# Patient Record
Sex: Female | Born: 1950 | Race: White | Hispanic: No
Health system: Southern US, Community
[De-identification: ages and names within clinical notes are randomized; demographics above are authoritative.]

## PROBLEM LIST (undated history)

## (undated) DIAGNOSIS — I482 Chronic atrial fibrillation, unspecified: Secondary | ICD-10-CM

## (undated) DIAGNOSIS — J449 Chronic obstructive pulmonary disease, unspecified: Secondary | ICD-10-CM

## (undated) DIAGNOSIS — M069 Rheumatoid arthritis, unspecified: Secondary | ICD-10-CM

## (undated) DIAGNOSIS — J9621 Acute and chronic respiratory failure with hypoxia: Secondary | ICD-10-CM

---

## 2018-04-27 ENCOUNTER — Inpatient Hospital Stay
Admission: RE | Admit: 2018-04-27 | Discharge: 2018-06-22 | Disposition: A | Discharge status: Skilled Nursing Facility | Payer: Medicare HMO | Source: Ambulatory Visit | Attending: Internal Medicine | Admitting: Internal Medicine

## 2018-04-27 ENCOUNTER — Other Ambulatory Visit (HOSPITAL_COMMUNITY): Payer: Medicare HMO

## 2018-04-27 DIAGNOSIS — M069 Rheumatoid arthritis, unspecified: Secondary | ICD-10-CM | POA: Diagnosis present

## 2018-04-27 DIAGNOSIS — I482 Chronic atrial fibrillation, unspecified: Secondary | ICD-10-CM | POA: Diagnosis present

## 2018-04-27 DIAGNOSIS — J9621 Acute and chronic respiratory failure with hypoxia: Secondary | ICD-10-CM | POA: Diagnosis present

## 2018-04-27 DIAGNOSIS — J9690 Respiratory failure, unspecified, unspecified whether with hypoxia or hypercapnia: Secondary | ICD-10-CM

## 2018-04-27 DIAGNOSIS — Z930 Tracheostomy status: Secondary | ICD-10-CM

## 2018-04-27 DIAGNOSIS — T17908A Unspecified foreign body in respiratory tract, part unspecified causing other injury, initial encounter: Secondary | ICD-10-CM

## 2018-04-27 DIAGNOSIS — R109 Unspecified abdominal pain: Secondary | ICD-10-CM

## 2018-04-27 DIAGNOSIS — Z931 Gastrostomy status: Secondary | ICD-10-CM

## 2018-04-27 DIAGNOSIS — J9600 Acute respiratory failure, unspecified whether with hypoxia or hypercapnia: Secondary | ICD-10-CM

## 2018-04-27 DIAGNOSIS — J189 Pneumonia, unspecified organism: Secondary | ICD-10-CM

## 2018-04-27 DIAGNOSIS — R079 Chest pain, unspecified: Secondary | ICD-10-CM

## 2018-04-27 DIAGNOSIS — J111 Influenza due to unidentified influenza virus with other respiratory manifestations: Secondary | ICD-10-CM

## 2018-04-27 DIAGNOSIS — J449 Chronic obstructive pulmonary disease, unspecified: Secondary | ICD-10-CM | POA: Diagnosis present

## 2018-04-27 HISTORY — DX: Acute and chronic respiratory failure with hypoxia: J96.21

## 2018-04-27 HISTORY — DX: Chronic obstructive pulmonary disease, unspecified: J44.9

## 2018-04-27 HISTORY — DX: Rheumatoid arthritis, unspecified: M06.9

## 2018-04-27 HISTORY — DX: Chronic atrial fibrillation, unspecified: I48.20

## 2018-04-27 LAB — BLOOD GAS, ARTERIAL
Acid-Base Excess: 5.8 mmol/L — ABNORMAL HIGH (ref 0.0–2.0)
Bicarbonate: 29.7 mmol/L — ABNORMAL HIGH (ref 20.0–28.0)
FIO2: 50
MECHVT: 400 mL
O2 Saturation: 99.4 %
PEEP: 8 cmH2O
Patient temperature: 98.6
RATE: 20 resp/min
pCO2 arterial: 42.4 mmHg (ref 32.0–48.0)
pH, Arterial: 7.46 — ABNORMAL HIGH (ref 7.350–7.450)
pO2, Arterial: 132 mmHg — ABNORMAL HIGH (ref 83.0–108.0)

## 2018-04-27 MED ORDER — IOPAMIDOL (ISOVUE-300) INJECTION 61%
INTRAVENOUS | Status: AC
  Filled 2018-04-27: qty 50

## 2018-04-28 ENCOUNTER — Encounter: Payer: Self-pay | Admitting: Internal Medicine

## 2018-04-28 DIAGNOSIS — M069 Rheumatoid arthritis, unspecified: Secondary | ICD-10-CM | POA: Diagnosis present

## 2018-04-28 DIAGNOSIS — J449 Chronic obstructive pulmonary disease, unspecified: Secondary | ICD-10-CM | POA: Diagnosis not present

## 2018-04-28 DIAGNOSIS — Z930 Tracheostomy status: Secondary | ICD-10-CM

## 2018-04-28 DIAGNOSIS — J9621 Acute and chronic respiratory failure with hypoxia: Secondary | ICD-10-CM | POA: Diagnosis not present

## 2018-04-28 DIAGNOSIS — I482 Chronic atrial fibrillation, unspecified: Secondary | ICD-10-CM | POA: Diagnosis present

## 2018-04-28 LAB — CBC WITH DIFFERENTIAL/PLATELET
Abs Immature Granulocytes: 0 10*3/uL (ref 0.00–0.07)
Basophils Absolute: 0.2 10*3/uL — ABNORMAL HIGH (ref 0.0–0.1)
Basophils Relative: 1 %
Eosinophils Absolute: 0 10*3/uL (ref 0.0–0.5)
Eosinophils Relative: 0 %
HCT: 28.9 % — ABNORMAL LOW (ref 36.0–46.0)
Hemoglobin: 9.1 g/dL — ABNORMAL LOW (ref 12.0–15.0)
LYMPHS ABS: 3.2 10*3/uL (ref 0.7–4.0)
Lymphocytes Relative: 19 %
MCH: 30.4 pg (ref 26.0–34.0)
MCHC: 31.5 g/dL (ref 30.0–36.0)
MCV: 96.7 fL (ref 80.0–100.0)
Monocytes Absolute: 1 10*3/uL (ref 0.1–1.0)
Monocytes Relative: 6 %
Neutro Abs: 12.4 10*3/uL — ABNORMAL HIGH (ref 1.7–7.7)
Neutrophils Relative %: 74 %
Platelets: 982 10*3/uL (ref 150–400)
RBC: 2.99 MIL/uL — ABNORMAL LOW (ref 3.87–5.11)
RDW: 15.8 % — ABNORMAL HIGH (ref 11.5–15.5)
WBC: 16.8 10*3/uL — ABNORMAL HIGH (ref 4.0–10.5)
nRBC: 0 /100 WBC
nRBC: 0.1 % (ref 0.0–0.2)

## 2018-04-28 LAB — T4, FREE: Free T4: 1.09 ng/dL (ref 0.82–1.77)

## 2018-04-28 LAB — COMPREHENSIVE METABOLIC PANEL
ALT: 81 U/L — ABNORMAL HIGH (ref 0–44)
AST: 65 U/L — ABNORMAL HIGH (ref 15–41)
Albumin: 1.9 g/dL — ABNORMAL LOW (ref 3.5–5.0)
Alkaline Phosphatase: 35 U/L — ABNORMAL LOW (ref 38–126)
Anion gap: 10 (ref 5–15)
BUN: 10 mg/dL (ref 8–23)
CHLORIDE: 91 mmol/L — AB (ref 98–111)
CO2: 30 mmol/L (ref 22–32)
Calcium: 8.9 mg/dL (ref 8.9–10.3)
Creatinine, Ser: 0.33 mg/dL — ABNORMAL LOW (ref 0.44–1.00)
GFR calc Af Amer: >60 mL/min (ref >60)
GFR calc non Af Amer: >60 mL/min (ref >60)
Glucose, Bld: 129 mg/dL — ABNORMAL HIGH (ref 70–99)
POTASSIUM: 4 mmol/L (ref 3.5–5.1)
Sodium: 131 mmol/L — ABNORMAL LOW (ref 135–145)
Total Bilirubin: 0.5 mg/dL (ref 0.3–1.2)
Total Protein: 5.9 g/dL — ABNORMAL LOW (ref 6.5–8.1)

## 2018-04-28 LAB — MAGNESIUM: Magnesium: 1.7 mg/dL (ref 1.7–2.4)

## 2018-04-28 LAB — PROTIME-INR
INR: 1.77
Prothrombin Time: 20.4 seconds — ABNORMAL HIGH (ref 11.4–15.2)

## 2018-04-28 LAB — PHOSPHORUS: Phosphorus: 4 mg/dL (ref 2.5–4.6)

## 2018-04-28 LAB — TSH: TSH: 2.521 u[IU]/mL (ref 0.350–4.500)

## 2018-04-28 LAB — HEMOGLOBIN A1C
Hgb A1c MFr Bld: 5.2 % (ref 4.8–5.6)
Mean Plasma Glucose: 102.54 mg/dL

## 2018-04-28 MED ORDER — CULTURELLE PO CAPS
1.00 | ORAL_CAPSULE | ORAL | Status: DC
Start: 2018-04-27 — End: 2018-04-28

## 2018-04-28 MED ORDER — GENERIC EXTERNAL MEDICATION
1000.00 | Status: DC
Start: ? — End: 2018-04-28

## 2018-04-28 MED ORDER — CHLORHEXIDINE GLUCONATE 0.12 % MT SOLN
15.00 | OROMUCOSAL | Status: DC
Start: 2018-04-27 — End: 2018-04-28

## 2018-04-28 MED ORDER — DEXTROSE 10 % IV SOLN
250.00 | INTRAVENOUS | Status: DC
Start: ? — End: 2018-04-28

## 2018-04-28 MED ORDER — GENERIC EXTERNAL MEDICATION
2.50 | Status: DC
Start: ? — End: 2018-04-28

## 2018-04-28 MED ORDER — LORAZEPAM 2 MG/ML IJ SOLN
2.00 | INTRAMUSCULAR | Status: DC
Start: ? — End: 2018-04-28

## 2018-04-28 MED ORDER — METOPROLOL TARTRATE 25 MG PO TABS
25.00 | ORAL_TABLET | ORAL | Status: DC
Start: 2018-04-27 — End: 2018-04-28

## 2018-04-28 MED ORDER — ACETAMINOPHEN 325 MG PO TABS
650.00 | ORAL_TABLET | ORAL | Status: DC
Start: ? — End: 2018-04-28

## 2018-04-28 MED ORDER — MONTELUKAST SODIUM 10 MG PO TABS
10.00 | ORAL_TABLET | ORAL | Status: DC
Start: 2018-04-27 — End: 2018-04-28

## 2018-04-28 MED ORDER — FAMOTIDINE 20 MG/2ML IV SOLN
20.00 | INTRAVENOUS | Status: DC
Start: 2018-04-27 — End: 2018-04-28

## 2018-04-28 MED ORDER — ONDANSETRON 4 MG PO TBDP
4.00 | ORAL_TABLET | ORAL | Status: DC
Start: ? — End: 2018-04-28

## 2018-04-28 MED ORDER — GENERIC EXTERNAL MEDICATION
10.00 | Status: DC
Start: ? — End: 2018-04-28

## 2018-04-28 MED ORDER — GENERIC EXTERNAL MEDICATION
1.00 | Status: DC
Start: ? — End: 2018-04-28

## 2018-04-28 MED ORDER — PROSOURCE TF PO LIQD
90.00 | ORAL | Status: DC
Start: 2018-04-28 — End: 2018-04-28

## 2018-04-28 MED ORDER — ONDANSETRON HCL 4 MG/2ML IJ SOLN
4.00 | INTRAMUSCULAR | Status: DC
Start: ? — End: 2018-04-28

## 2018-04-28 MED ORDER — NYSTATIN 100000 UNIT/GM EX POWD
CUTANEOUS | Status: DC
Start: 2018-04-27 — End: 2018-04-28

## 2018-04-28 MED ORDER — MELATONIN 3 MG PO TABS
3.00 | ORAL_TABLET | ORAL | Status: DC
Start: ? — End: 2018-04-28

## 2018-04-28 MED ORDER — GENERIC EXTERNAL MEDICATION
90.00 | Status: DC
Start: 2018-04-27 — End: 2018-04-28

## 2018-04-28 MED ORDER — GENERIC EXTERNAL MEDICATION
5.00 | Status: DC
Start: ? — End: 2018-04-28

## 2018-04-28 MED ORDER — INSULIN ASPART 100 UNIT/ML ~~LOC~~ SOLN
SUBCUTANEOUS | Status: DC
Start: 2018-04-27 — End: 2018-04-28

## 2018-04-28 MED ORDER — DOCUSATE SODIUM 100 MG PO CAPS
100.00 | ORAL_CAPSULE | ORAL | Status: DC
Start: ? — End: 2018-04-28

## 2018-04-28 MED ORDER — ASPIRIN 81 MG PO CHEW
81.00 | CHEWABLE_TABLET | ORAL | Status: DC
Start: 2018-04-28 — End: 2018-04-28

## 2018-04-28 MED ORDER — GENERIC EXTERNAL MEDICATION
30.00 | Status: DC
Start: 2018-04-28 — End: 2018-04-28

## 2018-04-28 MED ORDER — DIGOXIN 0.05 MG/ML PO SOLN
250.00 | ORAL | Status: DC
Start: 2018-04-28 — End: 2018-04-28

## 2018-04-28 MED ORDER — OXYCODONE HCL 5 MG/5ML PO SOLN
10.00 | ORAL | Status: DC
Start: 2018-04-27 — End: 2018-04-28

## 2018-04-28 MED ORDER — APIXABAN 5 MG PO TABS
5.00 | ORAL_TABLET | ORAL | Status: DC
Start: 2018-04-27 — End: 2018-04-28

## 2018-04-28 MED ORDER — GENERIC EXTERNAL MEDICATION
12.00 | Status: DC
Start: ? — End: 2018-04-28

## 2018-04-28 MED ORDER — GENERIC EXTERNAL MEDICATION
4000.00 | Status: DC
Start: ? — End: 2018-04-28

## 2018-04-28 MED ORDER — GENERIC EXTERNAL MEDICATION
650.00 | Status: DC
Start: ? — End: 2018-04-28

## 2018-04-28 MED ORDER — MAGNESIUM SULFATE 4 GM/100ML IV SOLN
4.00 | INTRAVENOUS | Status: DC
Start: ? — End: 2018-04-28

## 2018-04-28 MED ORDER — NALOXONE HCL 0.4 MG/ML IJ SOLN
.04 | INTRAMUSCULAR | Status: DC
Start: ? — End: 2018-04-28

## 2018-04-28 MED ORDER — LORAZEPAM 1 MG PO TABS
1.00 | ORAL_TABLET | ORAL | Status: DC
Start: 2018-04-27 — End: 2018-04-28

## 2018-04-28 MED ORDER — GENERIC EXTERNAL MEDICATION
180.00 | Status: DC
Start: 2018-04-27 — End: 2018-04-28

## 2018-04-28 MED ORDER — IPRATROPIUM-ALBUTEROL 0.5-2.5 (3) MG/3ML IN SOLN
3.00 | RESPIRATORY_TRACT | Status: DC
Start: 2018-04-27 — End: 2018-04-28

## 2018-04-28 MED ORDER — PREDNISONE 5 MG PO TABS
5.00 | ORAL_TABLET | ORAL | Status: DC
Start: 2018-04-28 — End: 2018-04-28

## 2018-04-28 MED ORDER — LORATADINE 10 MG PO TABS
10.00 | ORAL_TABLET | ORAL | Status: DC
Start: 2018-04-28 — End: 2018-04-28

## 2018-04-28 MED ORDER — CARBOXYMETHYLCELLULOSE SOD PF 0.5 % OP SOLN
1.00 | OPHTHALMIC | Status: DC
Start: 2018-04-27 — End: 2018-04-28

## 2018-04-28 NOTE — Consult Note (Signed)
Pulmonary Critical Care Medicine [facility] [place]  PULMONARY SERVICE  Date of Service: 04/28/2018  PULMONARY CRITICAL CARE CONSULT   Brittney Lowe  MAY:[id number]  DOB: 1950-09-22   DOA: 04/27/2018  Referring Physician: Carron Curie, MD  HPI: Brittney Lowe is a 68 y.o. female seen for follow up of Acute on Chronic Respiratory Failure.  Patient has complicated history as follows has a past medical history significant for gastroesophageal reflux disease hypertension COPD chronic respiratory failure with oxygen dependence at home rheumatoid arthritis chronic anxiety disorder.  Patient developed increasing shortness of breath from her baseline and she was not able to perform any of her activities because of the shortness of breath.  She had been having some cough low-grade fever.  Patient was bringing up dark green sputum for about 2 or 3 weeks.  In the transferring facility patient was admitted to at the time of evaluation was felt to have acute pneumonia she was treated with Rocephin and azithromycin and also was given some Zofran for vomiting.  She ended up having to be intubated.  Subsequent hospital course she was attempted at noninvasive ventilation initially which she refused.  The patient was initially started on pressure control and subsequently weaned to pressure support ventilation and switched over to volume control ventilation.  The patient was attempted at weaning however she failed and eventually ended up having to have a tracheostomy.  Review of Systems:  ROS performed and is unremarkable other than noted above.  PAST MEDICAL HISTORY Past Medical History:  Diagnosis Date  . CAD (coronary artery disease)  . Community acquired pneumonia  . COPD (chronic obstructive pulmonary disease) (HCC)  oxygen 2L PRN AND HS  . GERD (gastroesophageal reflux disease)  . History of home oxygen therapy  . Hypertension  . Latex sensitivity  . MI (myocardial infarction)  (HCC) 2000  . RA (rheumatoid arthritis) (HCC)   PAST SURGICAL HISTORY Past Surgical History:  Procedure Laterality Date  . HX CHOLECYSTECTOMY  . HX HEART CATHETERIZATION 2000,  . HX PARTIAL HYSTERECTOMY  . HX SPINAL SURGERY  . PR COLONOSCOPY FLX DX W/COLLJ SPEC WHEN PFRMD N/A 05/27/2015  COLONOSCOPY FLX DX W/COLLJ SPEC WHEN PFRMD performed by Ardine Eng, MD at [facility]   ALLERGIES Allergies  Allergen Reactions  . Moxifloxacin Hives  . Adhesive Tape-Silicones Swelling  . Amoxicillin-Pot Clavulanate Nausea and Vomiting and Abdominal Pain  . Clarithromycin Nausea and Vomiting, Abdominal Pain and Other (See Comments)  . Codeine Nausea and Vomiting  . Latex Rash and Other (See Comments)  . Spiriva Respimat [Tiotropium Bromide] Other (See Comments)  Sore throat and blisters in throat  . Sulfa (Sulfonamide Antibiotics) Unknown and Nausea and Vomiting  Patient does not remember, it was many years ago (>10)  . Sulfamethoxazole-Trimethoprim Unknown and Nausea and Vomiting  . Tiotropium Other (See Comments)  Other reaction(s): Pain in throat (finding)   FAMILY HISTORY Family History  Problem Relation Name Age of Onset  . COPD Father  . Heart Disease Father  . Heart Disease Mother  . COPD Mother  . Lung Cancer Mother   SOCIAL HISTORY Social History   Tobacco Use  . Smoking status: Former Smoker  Last attempt to quit: 04/18/2011  Years since quitting: 6.9  . Smokeless tobacco: Never Used  Substance Use Topics  . Alcohol use: No  . Drug use: No     Medications: Reviewed on Rounds  Physical Exam:  Vitals: Temperature 98.1 pulse 106 respiratory 14 blood pressure  140/64 saturations 100%  Ventilator Settings mode of ventilation assist control FiO2 50% tidal volume 379 PEEP 8  . General: Comfortable at this time . Eyes: Grossly normal lids, irises & conjunctiva . ENT: grossly tongue is normal . Neck: no obvious mass . Cardiovascular: S1-S2 normal no gallop or  rub . Respiratory: Coarse rhonchi noted bilaterally . Abdomen: Soft and nontender . Skin: no rash seen on limited exam . Musculoskeletal: not rigid . Psychiatric:unable to assess . Neurologic: no seizure no involuntary movements         Labs on Admission:  Basic Metabolic Panel: Recent Labs  Lab 04/28/18 0514  NA 131*  K 4.0  CL 91*  CO2 30  GLUCOSE 129*  BUN 10  CREATININE 0.33*  CALCIUM 8.9  MG 1.7  PHOS 4.0    Recent Labs  Lab 04/27/18 1801  PHART 7.460*  PCO2ART 42.4  PO2ART 132*  HCO3 29.7*  O2SAT 99.4    Liver Function Tests: Recent Labs  Lab 04/28/18 0514  AST 65*  ALT 81*  ALKPHOS 35*  BILITOT 0.5  PROT 5.9*  ALBUMIN 1.9*   No results for input(s): LIPASE, AMYLASE in the last 168 hours. No results for input(s): AMMONIA in the last 168 hours.  CBC: Recent Labs  Lab 04/28/18 0514  WBC 16.8*  NEUTROABS 12.4*  HGB 9.1*  HCT 28.9*  MCV 96.7  PLT 982*    Cardiac Enzymes: No results for input(s): CKTOTAL, CKMB, CKMBINDEX, TROPONINI in the last 168 hours.  BNP (last 3 results) No results for input(s): BNP in the last 8760 hours.  ProBNP (last 3 results) No results for input(s): PROBNP in the last 8760 hours.   Radiological Exams on Admission: Dg Chest Port 1 View  Result Date: 04/27/2018 CLINICAL DATA:  Tracheotomy status.  Patient for PEG tube placement. EXAM: PORTABLE CHEST 1 VIEW COMPARISON:  None. FINDINGS: Tracheostomy tube tip projects within the upper thoracic trachea, 4.7 cm above the Carina. Right PICC has its tip in the mid superior vena cava. Cardiac silhouette is normal in size. No mediastinal or hilar masses. Lungs are hyperexpanded. There are prominent bronchovascular markings with mild interstitial thickening most evident in the bases. No evidence of pneumonia or pulmonary edema. No pleural effusion or pneumothorax. Skeletal structures are grossly intact. IMPRESSION: 1. No acute cardiopulmonary disease. 2. Well-positioned  tracheostomy tube. PICC tip projects in the mid superior vena cava. Electronically Signed   By: Amie Portlandavid  Ormond M.D.   On: 04/27/2018 18:16   Dg Abd Portable 1v  Result Date: 04/27/2018 CLINICAL DATA:  Evaluate gastrostomy tube position at bedside. EXAM: PORTABLE ABDOMEN - 1 VIEW COMPARISON:  None. FINDINGS: Approximately 40 mL of Isovue 300 contrast were administered via the indwelling gastrostomy tube. The G tube is appropriately positioned within the proximal body of the stomach. There is no evidence of contrast extravasation. Contrast fills the normal-appearing gastric fundus. Moderate gaseous distension of loops of small bowel in the upper abdomen, with gas-filled normal caliber loops of small bowel throughout the remainder of the abdomen. Gas throughout normal caliber colon. No suggestion of free air on the supine image. Aortoiliac atherosclerosis without evidence of aneurysm. Degenerative changes involving lumbar spine. IMPRESSION: 1. The G-tube is appropriately positioned within the proximal body of the stomach. No evidence of contrast extravasation. 2. Mild generalized ileus. 3.  Aortic Atherosclerosis (ICD10-170.0) Electronically Signed   By: Hulan Saashomas  Lawrence M.D.   On: 04/27/2018 19:31    Assessment/Plan Active Problems:   Acute on  chronic respiratory failure with hypoxia (HCC)   COPD, severe (HCC)   Rheumatoid arthritis (HCC)   Steroid-dependent chronic obstructive pulmonary disease (HCC)   Atrial fibrillation, chronic   1. Acute on chronic respiratory failure with hypoxia patient has got severe COPD as a cause of her respiratory failure.  She is also chronic oxygen dependence.  The patient will be assessed within our SBI and we will try to wean her as she is able to tolerate. 2. Severe COPD she is oxygen dependent current inhaler regimen nebulizers as needed. 3. Chronic atrial fibrillation with rapid ventricular response treated we will continue with supportive care. 4. Rheumatoid  arthritis she is immunocompromised essentially because of the rheumatoid arthritis continue with supportive care. 5. Chronic steroid dependent we will monitor her pressures and electrolytes closely.  Continue with present management.  I have personally seen and evaluated the patient, evaluated laboratory and imaging results, formulated the assessment and plan and placed orders.  Patient is critically ill in danger of cardiac arrest and death The Patient requires high complexity decision making for assessment and support.  Case was discussed on Rounds with the Respiratory Therapy Staff Time Spent 70minutes  Yevonne PaxSaadat A , MD [facility] Pulmonary Critical Care Medicine Sleep Medicine

## 2018-04-29 DIAGNOSIS — I482 Chronic atrial fibrillation, unspecified: Secondary | ICD-10-CM | POA: Diagnosis not present

## 2018-04-29 DIAGNOSIS — J9621 Acute and chronic respiratory failure with hypoxia: Secondary | ICD-10-CM | POA: Diagnosis not present

## 2018-04-29 DIAGNOSIS — J449 Chronic obstructive pulmonary disease, unspecified: Secondary | ICD-10-CM | POA: Diagnosis not present

## 2018-04-29 DIAGNOSIS — M069 Rheumatoid arthritis, unspecified: Secondary | ICD-10-CM | POA: Diagnosis not present

## 2018-04-29 LAB — BASIC METABOLIC PANEL
Anion gap: 9 (ref 5–15)
BUN: 13 mg/dL (ref 8–23)
CALCIUM: 8.7 mg/dL — AB (ref 8.9–10.3)
CO2: 28 mmol/L (ref 22–32)
Chloride: 93 mmol/L — ABNORMAL LOW (ref 98–111)
Creatinine, Ser: <0.3 mg/dL — ABNORMAL LOW (ref 0.44–1.00)
Glucose, Bld: 170 mg/dL — ABNORMAL HIGH (ref 70–99)
Potassium: 4.6 mmol/L (ref 3.5–5.1)
SODIUM: 130 mmol/L — AB (ref 135–145)

## 2018-04-29 LAB — PATHOLOGIST SMEAR REVIEW

## 2018-04-29 LAB — MAGNESIUM: Magnesium: 1.7 mg/dL (ref 1.7–2.4)

## 2018-04-29 NOTE — Consult Note (Signed)
Referring Physician: Dr. Winn JockAli Hijazi  Brittney Lowe is an 68 y.o. female.                       Chief Complaint: SVT evaluation  HPI: 68 year old female with acute respiratory failure, COPD, HTN, rheumatoid arthritis, pneumonia, status post tracheostomy after failed extubation has episodes of SVT treated with digoxin, diltiazem and metoprolol. EKG now shows atrial flutter with controlled ventricular response. Patient is not communicative.  Past Medical History:  Diagnosis Date  . Acute on chronic respiratory failure with hypoxia (HCC)   . Atrial fibrillation, chronic   . COPD, severe (HCC)   . Rheumatoid arthritis (HCC)   . Steroid-dependent chronic obstructive pulmonary disease (HCC)       The histories are not reviewed yet. Please review them in the "History" navigator section and refresh this SmartLink.  No family history on file. Social History:  has no history on file for tobacco, alcohol, and drug.  Allergies: Moxifloxacin, Augmentin, Clarithromycin, Codeine, Spiriva, Latex and Sulfa antibiotics.  No medications prior to admission.    Results for orders placed or performed during the hospital encounter of 04/27/18 (from the past 48 hour(s))  Blood gas, arterial     Status: Abnormal   Collection Time: 04/27/18  6:01 PM  Result Value Ref Range   FIO2 50.00    Delivery systems VENTILATOR    Mode ASSIST CONTROL    VT 400 mL   LHR 20 resp/min   Peep/cpap 8.0 cm H20   pH, Arterial 7.460 (H) 7.350 - 7.450   pCO2 arterial 42.4 32.0 - 48.0 mmHg   pO2, Arterial 132 (H) 83.0 - 108.0 mmHg   Bicarbonate 29.7 (H) 20.0 - 28.0 mmol/L   Acid-Base Excess 5.8 (H) 0.0 - 2.0 mmol/L   O2 Saturation 99.4 %   Patient temperature 98.6    Collection site RIGHT RADIAL    Drawn by COLLECTED BY RT    Sample type ARTERIAL DRAW    Allens test (pass/fail) PASS PASS  Comprehensive metabolic panel     Status: Abnormal   Collection Time: 04/28/18  5:14 AM  Result Value Ref Range   Sodium  131 (L) 135 - 145 mmol/L   Potassium 4.0 3.5 - 5.1 mmol/L   Chloride 91 (L) 98 - 111 mmol/L   CO2 30 22 - 32 mmol/L   Glucose, Bld 129 (H) 70 - 99 mg/dL   BUN 10 8 - 23 mg/dL   Creatinine, Ser 1.610.33 (L) 0.44 - 1.00 mg/dL   Calcium 8.9 8.9 - 09.610.3 mg/dL   Total Protein 5.9 (L) 6.5 - 8.1 g/dL   Albumin 1.9 (L) 3.5 - 5.0 g/dL   AST 65 (H) 15 - 41 U/L   ALT 81 (H) 0 - 44 U/L   Alkaline Phosphatase 35 (L) 38 - 126 U/L   Total Bilirubin 0.5 0.3 - 1.2 mg/dL   GFR calc non Af Amer >60 >60 mL/min   GFR calc Af Amer >60 >60 mL/min   Anion gap 10 5 - 15    Comment: Performed at [facility], [address] [place], [place] [address]  Magnesium     Status: None   Collection Time: 04/28/18  5:14 AM  Result Value Ref Range   Magnesium 1.7 1.7 - 2.4 mg/dL    Comment: Performed at [facility], [address] [place], [place] [address]  Phosphorus     Status: None  Collection Time: 04/28/18  5:14 AM  Result Value Ref Range   Phosphorus 4.0 2.5 - 4.6 mg/dL    Comment: Performed at [facility], [address], [place], [place] [address]  Hemoglobin A1c     Status: None   Collection Time: 04/28/18  5:14 AM  Result Value Ref Range   Hgb A1c MFr Bld 5.2 4.8 - 5.6 %    Comment: (NOTE) Pre diabetes:          5.7%-6.4% Diabetes:              >6.4% Glycemic control for   <7.0% adults with diabetes    Mean Plasma Glucose 102.54 mg/dL    Comment: Performed at [facility], [address] [place], [place] [address]  TSH     Status: None   Collection Time: 04/28/18  5:14 AM  Result Value Ref Range   TSH 2.521 0.350 - 4.500 uIU/mL    Comment: Performed by a 3rd Generation assay with a functional sensitivity of <=0.01 uIU/mL. Performed at [facility], [address] [place], [place] [address]   T4, free     Status: None   Collection Time: 04/28/18  5:14 AM  Result Value Ref Range   Free T4 1.09 0.82 - 1.77 ng/dL    Comment: (NOTE) Biotin ingestion may interfere  with free T4 tests. If the results are inconsistent with the TSH level, previous test results, or the clinical presentation, then consider biotin interference. If needed, order repeat testing after stopping biotin. Performed at [facility], [address] [place], [place] [address]   CBC with Differential/Platelet     Status: Abnormal   Collection Time: 04/28/18  5:14 AM  Result Value Ref Range   WBC 16.8 (H) 4.0 - 10.5 K/uL   RBC 2.99 (L) 3.87 - 5.11 MIL/uL   Hemoglobin 9.1 (L) 12.0 - 15.0 g/dL   HCT 83.8 (L) 18.4 - 03.7 %   MCV 96.7 80.0 - 100.0 fL   MCH 30.4 26.0 - 34.0 pg   MCHC 31.5 30.0 - 36.0 g/dL   RDW 54.3 (H) 60.6 - 77.0 %   Platelets 982 (HH) 150 - 400 K/uL    Comment: REPEATED TO VERIFY THIS CRITICAL RESULT HAS VERIFIED AND BEEN CALLED TO E.ASHLEY,RN BY GEOFFREY MCADOO ON 01 12 2020 AT 0553, AND HAS BEEN READ BACK.     nRBC 0.1 0.0 - 0.2 %   Neutrophils Relative % 74 %   Neutro Abs 12.4 (H) 1.7 - 7.7 K/uL   Lymphocytes Relative 19 %   Lymphs Abs 3.2 0.7 - 4.0 K/uL   Monocytes Relative 6 %   Monocytes Absolute 1.0 0.1 - 1.0 K/uL   Eosinophils Relative 0 %   Eosinophils Absolute 0.0 0.0 - 0.5 K/uL   Basophils Relative 1 %   Basophils Absolute 0.2 (H) 0.0 - 0.1 K/uL   WBC Morphology See Note     Comment: Mild Left Shift. 1 to 5% Metas and Myelos, Occ Pro Noted.   nRBC 0 0 /100 WBC   Abs Immature Granulocytes 0.00 0.00 - 0.07 K/uL   Polychromasia PRESENT     Comment: Performed at [facility], [address], [place], [place] [address]  Protime-INR     Status: Abnormal   Collection Time: 04/28/18  5:14 AM  Result Value Ref Range   Prothrombin Time 20.4 (H) 11.4 - 15.2 seconds   INR 1.77  Comment: Performed at [facility], [address], [place], [place] [address]   Dg Chest Port 1 View  Result Date: 04/27/2018 CLINICAL DATA:  Tracheotomy status.  Patient for PEG tube placement. EXAM: PORTABLE CHEST 1 VIEW COMPARISON:  None. FINDINGS:  Tracheostomy tube tip projects within the upper thoracic trachea, 4.7 cm above the Carina. Right PICC has its tip in the mid superior vena cava. Cardiac silhouette is normal in size. No mediastinal or hilar masses. Lungs are hyperexpanded. There are prominent bronchovascular markings with mild interstitial thickening most evident in the bases. No evidence of pneumonia or pulmonary edema. No pleural effusion or pneumothorax. Skeletal structures are grossly intact. IMPRESSION: 1. No acute cardiopulmonary disease. 2. Well-positioned tracheostomy tube. PICC tip projects in the mid superior vena cava. Electronically Signed   By: Amie Portland M.D.   On: 04/27/2018 18:16   Dg Abd Portable 1v  Result Date: 04/27/2018 CLINICAL DATA:  Evaluate gastrostomy tube position at bedside. EXAM: PORTABLE ABDOMEN - 1 VIEW COMPARISON:  None. FINDINGS: Approximately 40 mL of Isovue 300 contrast were administered via the indwelling gastrostomy tube. The G tube is appropriately positioned within the proximal body of the stomach. There is no evidence of contrast extravasation. Contrast fills the normal-appearing gastric fundus. Moderate gaseous distension of loops of small bowel in the upper abdomen, with gas-filled normal caliber loops of small bowel throughout the remainder of the abdomen. Gas throughout normal caliber colon. No suggestion of free air on the supine image. Aortoiliac atherosclerosis without evidence of aneurysm. Degenerative changes involving lumbar spine. IMPRESSION: 1. The G-tube is appropriately positioned within the proximal body of the stomach. No evidence of contrast extravasation. 2. Mild generalized ileus. 3.  Aortic Atherosclerosis (ICD10-170.0) Electronically Signed   By: Hulan Saas M.D.   On: 04/27/2018 19:31    Review Of Systems Unable to obtain.   There were no vitals taken for this visit. There is no height or weight on file to calculate BMI. General appearance: appears stated age and in  mild respiratory distress Head: Normocephalic, atraumatic. Eyes: Blue eyes, pink conjunctiva, corneas clear.  Neck: No adenopathy, no carotid bruit, no JVD, supple, symmetrical, tracheostomy tube in place. Thyroid not enlarged. Resp: Clearing to auscultation bilaterally. Cardio: Regular rate and rhythm, S1, S2 normal, II/VI systolic murmur, no click, rub or gallop GI: Soft, non-tender; bowel sounds normal; no organomegaly. PEG tube with binder in place. Extremities: 1 + edema, no cyanosis or clubbing. Skin: Warm and dry.  Neurologic: Alert and oriented X 0.   Assessment/Plan Atrial flutter with RVR Chronic respiratory failure HTN COPD Rheumatoid arthritis  Add amiodarone 200 mg. Daily. Echocardiogram for LVF and Valvular function, LA, RA size.  Ricki Rodriguez, MD  04/29/2018, 11:06 AM

## 2018-04-29 NOTE — Progress Notes (Signed)
Pulmonary Critical Care Medicine [facility] [place]   PULMONARY CRITICAL CARE SERVICE  PROGRESS NOTE  Date of Service: 04/29/2018  Brittney Lowe  UDJ:[id number]  DOB: 1950/05/10   DOA: 04/27/2018  Referring Physician: Carron Curie, MD  HPI: Brittney Lowe is a 68 y.o. female seen for follow up of Acute on Chronic Respiratory Failure.  Patient has been having issues with cardiac arrhythmias.  She has been noted to be significantly tachycardic she was seen by cardiology and their recommendations have been duly noted.  In the meantime we will keep her on the ventilator and full support not to be weaned at this time  Medications: Reviewed on Rounds  Physical Exam:  Vitals: Temperature 98.6 pulse has been 88-170 respiratory rate 36 blood pressure 136/68 saturations are 100%  Ventilator Settings mode ventilation assist control FiO2 40% tidal line 383 PEEP 8  . General: Comfortable at this time . Eyes: Grossly normal lids, irises & conjunctiva . ENT: grossly tongue is normal . Neck: no obvious mass . Cardiovascular: S1 S2 normal no gallop . Respiratory: No rhonchi no rales are noted at this time . Abdomen: soft . Skin: no rash seen on limited exam . Musculoskeletal: not rigid . Psychiatric:unable to assess . Neurologic: no seizure no involuntary movements         Lab Data:   Basic Metabolic Panel: Recent Labs  Lab 04/28/18 0514 04/29/18 1033  NA 131* 130*  K 4.0 4.6  CL 91* 93*  CO2 30 28  GLUCOSE 129* 170*  BUN 10 13  CREATININE 0.33* <0.30*  CALCIUM 8.9 8.7*  MG 1.7 1.7  PHOS 4.0  --     ABG: Recent Labs  Lab 04/27/18 1801  PHART 7.460*  PCO2ART 42.4  PO2ART 132*  HCO3 29.7*  O2SAT 99.4    Liver Function Tests: Recent Labs  Lab 04/28/18 0514  AST 65*  ALT 81*  ALKPHOS 35*  BILITOT 0.5  PROT 5.9*  ALBUMIN 1.9*   No results for input(s): LIPASE, AMYLASE in the last 168 hours. No results for input(s): AMMONIA in the last 168  hours.  CBC: Recent Labs  Lab 04/28/18 0514  WBC 16.8*  NEUTROABS 12.4*  HGB 9.1*  HCT 28.9*  MCV 96.7  PLT 982*    Cardiac Enzymes: No results for input(s): CKTOTAL, CKMB, CKMBINDEX, TROPONINI in the last 168 hours.  BNP (last 3 results) No results for input(s): BNP in the last 8760 hours.  ProBNP (last 3 results) No results for input(s): PROBNP in the last 8760 hours.  Radiological Exams: Dg Chest Port 1 View  Result Date: 04/27/2018 CLINICAL DATA:  Tracheotomy status.  Patient for PEG tube placement. EXAM: PORTABLE CHEST 1 VIEW COMPARISON:  None. FINDINGS: Tracheostomy tube tip projects within the upper thoracic trachea, 4.7 cm above the Carina. Right PICC has its tip in the mid superior vena cava. Cardiac silhouette is normal in size. No mediastinal or hilar masses. Lungs are hyperexpanded. There are prominent bronchovascular markings with mild interstitial thickening most evident in the bases. No evidence of pneumonia or pulmonary edema. No pleural effusion or pneumothorax. Skeletal structures are grossly intact. IMPRESSION: 1. No acute cardiopulmonary disease. 2. Well-positioned tracheostomy tube. PICC tip projects in the mid superior vena cava. Electronically Signed   By: Amie Portland M.D.   On: 04/27/2018 18:16   Dg Abd Portable 1v  Result Date: 04/27/2018 CLINICAL DATA:  Evaluate gastrostomy tube position at bedside. EXAM: PORTABLE ABDOMEN - 1 VIEW COMPARISON:  None. FINDINGS: Approximately 40 mL of Isovue 300 contrast were administered via the indwelling gastrostomy tube. The G tube is appropriately positioned within the proximal body of the stomach. There is no evidence of contrast extravasation. Contrast fills the normal-appearing gastric fundus. Moderate gaseous distension of loops of small bowel in the upper abdomen, with gas-filled normal caliber loops of small bowel throughout the remainder of the abdomen. Gas throughout normal caliber colon. No suggestion of free air  on the supine image. Aortoiliac atherosclerosis without evidence of aneurysm. Degenerative changes involving lumbar spine. IMPRESSION: 1. The G-tube is appropriately positioned within the proximal body of the stomach. No evidence of contrast extravasation. 2. Mild generalized ileus. 3.  Aortic Atherosclerosis (ICD10-170.0) Electronically Signed   By: Hulan Saas M.D.   On: 04/27/2018 19:31    Assessment/Plan Active Problems:   Acute on chronic respiratory failure with hypoxia (HCC)   COPD, severe (HCC)   Rheumatoid arthritis (HCC)   Steroid-dependent chronic obstructive pulmonary disease (HCC)   Atrial fibrillation, chronic   1. Acute on chronic respiratory failure with hypoxia we will continue with full support on the ventilator.  Titrate oxygen down as tolerated no weaning for today. 2. Severe COPD at baseline continue present management 3. Rheumatoid arthritis continue with supportive care 4. Steroid-dependent COPD severe disease 5. Chronic atrial fibrillation rapid ventricular response cardiology is seeing the patient   I have personally seen and evaluated the patient, evaluated laboratory and imaging results, formulated the assessment and plan and placed orders. The Patient requires high complexity decision making for assessment and support.  Case was discussed on Rounds with the Respiratory Therapy Staff  Yevonne Pax, MD [facility] Pulmonary Critical Care Medicine Sleep Medicine

## 2018-04-30 DIAGNOSIS — M069 Rheumatoid arthritis, unspecified: Secondary | ICD-10-CM | POA: Diagnosis not present

## 2018-04-30 DIAGNOSIS — J9621 Acute and chronic respiratory failure with hypoxia: Secondary | ICD-10-CM | POA: Diagnosis not present

## 2018-04-30 DIAGNOSIS — I482 Chronic atrial fibrillation, unspecified: Secondary | ICD-10-CM | POA: Diagnosis not present

## 2018-04-30 DIAGNOSIS — J449 Chronic obstructive pulmonary disease, unspecified: Secondary | ICD-10-CM | POA: Diagnosis not present

## 2018-04-30 NOTE — Progress Notes (Signed)
Pulmonary Critical Care Medicine [facility] [place]   PULMONARY CRITICAL CARE SERVICE  PROGRESS NOTE  Date of Service: 04/30/2018  Brittney Lowe  PYK:[id number]  DOB: Jan 27, 1951   DOA: 04/27/2018  Referring Physician: Carron Curie, MD  HPI: Brittney Lowe is a 68 y.o. female seen for follow up of Acute on Chronic Respiratory Failure.  Patient was on full support apparently the RSB I was poor  Medications: Reviewed on Rounds  Physical Exam:  Vitals: Temperature 97.7 pulse 84 respiratory 22 blood pressure 149/80 saturations 100%  Ventilator Settings currently is on assist control FiO2 40% PEEP 8 tidal volume 456  . General: Comfortable at this time . Eyes: Grossly normal lids, irises & conjunctiva . ENT: grossly tongue is normal . Neck: no obvious mass . Cardiovascular: S1 S2 normal no gallop . Respiratory: No rhonchi or rales are noted at this time . Abdomen: soft . Skin: no rash seen on limited exam . Musculoskeletal: not rigid . Psychiatric:unable to assess . Neurologic: no seizure no involuntary movements         Lab Data:   Basic Metabolic Panel: Recent Labs  Lab 04/28/18 0514 04/29/18 1033  NA 131* 130*  K 4.0 4.6  CL 91* 93*  CO2 30 28  GLUCOSE 129* 170*  BUN 10 13  CREATININE 0.33* <0.30*  CALCIUM 8.9 8.7*  MG 1.7 1.7  PHOS 4.0  --     ABG: Recent Labs  Lab 04/27/18 1801  PHART 7.460*  PCO2ART 42.4  PO2ART 132*  HCO3 29.7*  O2SAT 99.4    Liver Function Tests: Recent Labs  Lab 04/28/18 0514  AST 65*  ALT 81*  ALKPHOS 35*  BILITOT 0.5  PROT 5.9*  ALBUMIN 1.9*   No results for input(s): LIPASE, AMYLASE in the last 168 hours. No results for input(s): AMMONIA in the last 168 hours.  CBC: Recent Labs  Lab 04/28/18 0514  WBC 16.8*  NEUTROABS 12.4*  HGB 9.1*  HCT 28.9*  MCV 96.7  PLT 982*    Cardiac Enzymes: No results for input(s): CKTOTAL, CKMB, CKMBINDEX, TROPONINI in the last 168 hours.  BNP (last 3  results) No results for input(s): BNP in the last 8760 hours.  ProBNP (last 3 results) No results for input(s): PROBNP in the last 8760 hours.  Radiological Exams: No results found.  Assessment/Plan Active Problems:   Acute on chronic respiratory failure with hypoxia (HCC)   COPD, severe (HCC)   Rheumatoid arthritis (HCC)   Steroid-dependent chronic obstructive pulmonary disease (HCC)   Atrial fibrillation, chronic   1. Acute on chronic respiratory failure with hypoxia we will continue with full vent support on assist control mode continue to check the weaning parameters and weaning potential 2. Severe COPD at baseline continue with supportive care 3. Rheumatoid arthritis at baseline 4. Steroid-dependent COPD at baseline continue present management 5. Chronic atrial fibrillation rate controlled   I have personally seen and evaluated the patient, evaluated laboratory and imaging results, formulated the assessment and plan and placed orders. The Patient requires high complexity decision making for assessment and support.  Case was discussed on Rounds with the Respiratory Therapy Staff  Yevonne Pax, MD [facility] Pulmonary Critical Care Medicine Sleep Medicine

## 2018-05-01 DIAGNOSIS — J449 Chronic obstructive pulmonary disease, unspecified: Secondary | ICD-10-CM | POA: Diagnosis not present

## 2018-05-01 DIAGNOSIS — M069 Rheumatoid arthritis, unspecified: Secondary | ICD-10-CM | POA: Diagnosis not present

## 2018-05-01 DIAGNOSIS — J9621 Acute and chronic respiratory failure with hypoxia: Secondary | ICD-10-CM | POA: Diagnosis not present

## 2018-05-01 DIAGNOSIS — I482 Chronic atrial fibrillation, unspecified: Secondary | ICD-10-CM | POA: Diagnosis not present

## 2018-05-01 NOTE — Progress Notes (Signed)
Pulmonary Critical Care Medicine [facility] [place]   PULMONARY CRITICAL CARE SERVICE  PROGRESS NOTE  Date of Service: 05/01/2018  Brittney Lowe  [id number]:[id number]  DOB: 1950-11-25   DOA: 04/27/2018  Referring Physician: Carron Curie, MD  HPI: Brittney Lowe is a 68 y.o. female seen for follow up of Acute on Chronic Respiratory Failure.  Patient is on the ventilator full support.  She was attempted at weaning today but did not tolerate it so was placed back on full support.  I and her husband  Medications: Reviewed on Rounds  Physical Exam:  Vitals: Temperature 97.0 pulse 73 respiratory 24 blood pressure 143/73 saturations 100%  Ventilator Settings mode of ventilation assist control FiO2 35% tidal volume 526 PEEP 5  . General: Comfortable at this time . Eyes: Grossly normal lids, irises & conjunctiva . ENT: grossly tongue is normal . Neck: no obvious mass . Cardiovascular: S1 S2 normal no gallop . Respiratory: No rhonchi or rales are noted at this time . Abdomen: soft . Skin: no rash seen on limited exam . Musculoskeletal: not rigid . Psychiatric:unable to assess . Neurologic: no seizure no involuntary movements         Lab Data:   Basic Metabolic Panel: Recent Labs  Lab 04/28/18 0514 04/29/18 1033  NA 131* 130*  K 4.0 4.6  CL 91* 93*  CO2 30 28  GLUCOSE 129* 170*  BUN 10 13  CREATININE 0.33* <0.30*  CALCIUM 8.9 8.7*  MG 1.7 1.7  PHOS 4.0  --     ABG: Recent Labs  Lab 04/27/18 1801  PHART 7.460*  PCO2ART 42.4  PO2ART 132*  HCO3 29.7*  O2SAT 99.4    Liver Function Tests: Recent Labs  Lab 04/28/18 0514  AST 65*  ALT 81*  ALKPHOS 35*  BILITOT 0.5  PROT 5.9*  ALBUMIN 1.9*   No results for input(s): LIPASE, AMYLASE in the last 168 hours. No results for input(s): AMMONIA in the last 168 hours.  CBC: Recent Labs  Lab 04/28/18 0514  WBC 16.8*  NEUTROABS 12.4*  HGB 9.1*  HCT 28.9*  MCV 96.7  PLT 982*    Cardiac  Enzymes: No results for input(s): CKTOTAL, CKMB, CKMBINDEX, TROPONINI in the last 168 hours.  BNP (last 3 results) No results for input(s): BNP in the last 8760 hours.  ProBNP (last 3 results) No results for input(s): PROBNP in the last 8760 hours.  Radiological Exams: No results found.  Assessment/Plan Active Problems:   Acute on chronic respiratory failure with hypoxia (HCC)   COPD, severe (HCC)   Rheumatoid arthritis (HCC)   Steroid-dependent chronic obstructive pulmonary disease (HCC)   Atrial fibrillation, chronic   1. Acute on chronic respiratory failure hypoxia we will continue with full support on assist control currently is on 35% FiO2 with a PEEP of 5 2. Severe COPD at baseline continue present management 3. Rheumatoid arthritis at baseline 4. Steroid-dependent COPD we will continue with current management 5. Chronic atrial fibrillation rate is controlled   I have personally seen and evaluated the patient, evaluated laboratory and imaging results, formulated the assessment and plan and placed orders. The Patient requires high complexity decision making for assessment and support.  Case was discussed on Rounds with the Respiratory Therapy Staff  Yevonne Pax, MD [facility] Pulmonary Critical Care Medicine Sleep Medicine

## 2018-05-02 DIAGNOSIS — M069 Rheumatoid arthritis, unspecified: Secondary | ICD-10-CM | POA: Diagnosis not present

## 2018-05-02 DIAGNOSIS — I482 Chronic atrial fibrillation, unspecified: Secondary | ICD-10-CM | POA: Diagnosis not present

## 2018-05-02 DIAGNOSIS — J449 Chronic obstructive pulmonary disease, unspecified: Secondary | ICD-10-CM | POA: Diagnosis not present

## 2018-05-02 DIAGNOSIS — J9621 Acute and chronic respiratory failure with hypoxia: Secondary | ICD-10-CM | POA: Diagnosis not present

## 2018-05-02 NOTE — Progress Notes (Signed)
[facility] [place]   PULMONARY CRITICAL CARE SERVICE  PROGRESS NOTE  Date of Service: 05/02/2018  Brittney Lowe  [id number]  DOB: [birth year]   DOA: 04/27/2018  Referring Physician: Carron CurieAli Hijazi, MD  HPI: Brittney GoodellDeborah A Lowe is a 68 y.o. female seen for follow up of Acute on Chronic Respiratory Failure.  At this time patient is on full vent support on assist control mode.  Patient has not been able to tolerate weaning attempts thus far so she remains on assist control  Medications: Reviewed on Rounds  Physical Exam:  Vitals: Temperature is 98.0 pulse 68 respiratory 24 blood pressure 120/68 saturations 100%  Ventilator Settings currently is on assist control FiO2 is 35% tidal volume 500 PEEP 5  . General: Comfortable at this time . Eyes: Grossly normal lids, irises & conjunctiva . ENT: grossly tongue is normal . Neck: no obvious mass . Cardiovascular: S1 S2 normal no gallop . Respiratory: Scattered rhonchi expansion equal . Abdomen: soft . Skin: no rash seen on limited exam . Musculoskeletal: not rigid . Psychiatric:unable to assess . Neurologic: no seizure no involuntary movements         Lab Data:   Basic Metabolic Panel: Recent Labs  Lab 04/28/18 0514 04/29/18 1033  NA 131* 130*  K 4.0 4.6  CL 91* 93*  CO2 30 28  GLUCOSE 129* 170*  BUN 10 13  CREATININE 0.33* <0.30*  CALCIUM 8.9 8.7*  MG 1.7 1.7  PHOS 4.0  --     ABG: Recent Labs  Lab 04/27/18 1801  PHART 7.460*  PCO2ART 42.4  PO2ART 132*  HCO3 29.7*  O2SAT 99.4    Liver Function Tests: Recent Labs  Lab 04/28/18 0514  AST 65*  ALT 81*  ALKPHOS 35*  BILITOT 0.5  PROT 5.9*  ALBUMIN 1.9*   No results for input(s): LIPASE, AMYLASE in the last 168 hours. No results for input(s): AMMONIA in the last 168 hours.  CBC: Recent Labs  Lab 04/28/18 0514  WBC 16.8*  NEUTROABS 12.4*  HGB 9.1*  HCT 28.9*  MCV 96.7  PLT 982*    Cardiac  Enzymes: No results for input(s): CKTOTAL, CKMB, CKMBINDEX, TROPONINI in the last 168 hours.  BNP (last 3 results) No results for input(s): BNP in the last 8760 hours.  ProBNP (last 3 results) No results for input(s): PROBNP in the last 8760 hours.  Radiological Exams: No results found.  Assessment/Plan Active Problems:   Acute on chronic respiratory failure with hypoxia (HCC)   COPD, severe (HCC)   Rheumatoid arthritis (HCC)   Steroid-dependent chronic obstructive pulmonary disease (HCC)   Atrial fibrillation, chronic   1. Acute on chronic respiratory failure with hypoxia we will continue with full vent support patient has failed RSB I multiple times. 2. Severe COPD at baseline we will continue present management 3. Rheumatoid arthritis at baseline 4. Steroid-dependent COPD continue present therapy 5. Chronic atrial fibrillation rate controlled   I have personally seen and evaluated the patient, evaluated laboratory and imaging results, formulated the assessment and plan and placed orders. The Patient requires high complexity decision making for assessment and support.  Case was discussed on Rounds with the Respiratory Therapy Staff  Yevonne PaxSaadat A Khan, MD [facility] Pulmonary Critical Care Medicine Sleep Medicine

## 2018-05-03 DIAGNOSIS — I482 Chronic atrial fibrillation, unspecified: Secondary | ICD-10-CM | POA: Diagnosis not present

## 2018-05-03 DIAGNOSIS — J449 Chronic obstructive pulmonary disease, unspecified: Secondary | ICD-10-CM | POA: Diagnosis not present

## 2018-05-03 DIAGNOSIS — M069 Rheumatoid arthritis, unspecified: Secondary | ICD-10-CM | POA: Diagnosis not present

## 2018-05-03 DIAGNOSIS — J9621 Acute and chronic respiratory failure with hypoxia: Secondary | ICD-10-CM | POA: Diagnosis not present

## 2018-05-03 NOTE — Progress Notes (Signed)
Pulmonary Critical Care Medicine [facility] [place]   PULMONARY CRITICAL CARE SERVICE  PROGRESS NOTE  Date of Service: 05/03/2018  CARYN DILORETO  RVU:[id number]  DOB: [birth year]   DOA: 04/27/2018  Referring Physician: Carron Curie, MD  HPI: Brittney Lowe is a 68 y.o. female seen for follow up of Acute on Chronic Respiratory Failure.  She continues to fail the RSB I she has been on tried multiple times.  Right now is on full support on assist control mode  Medications: Reviewed on Rounds  Physical Exam:  Vitals: Temperature 98.0 pulse 74 respiratory 25 blood pressure 124/68 saturations 100%  Ventilator Settings mode ventilation assist control FiO2 30% tidal volume 327 PEEP 5  . General: Comfortable at this time . Eyes: Grossly normal lids, irises & conjunctiva . ENT: grossly tongue is normal . Neck: no obvious mass . Cardiovascular: S1 S2 normal no gallop . Respiratory: No rhonchi no rales are noted at this time . Abdomen: soft . Skin: no rash seen on limited exam . Musculoskeletal: not rigid . Psychiatric:unable to assess . Neurologic: no seizure no involuntary movements         Lab Data:   Basic Metabolic Panel: Recent Labs  Lab 04/28/18 0514 04/29/18 1033  NA 131* 130*  K 4.0 4.6  CL 91* 93*  CO2 30 28  GLUCOSE 129* 170*  BUN 10 13  CREATININE 0.33* <0.30*  CALCIUM 8.9 8.7*  MG 1.7 1.7  PHOS 4.0  --     ABG: Recent Labs  Lab 04/27/18 1801  PHART 7.460*  PCO2ART 42.4  PO2ART 132*  HCO3 29.7*  O2SAT 99.4    Liver Function Tests: Recent Labs  Lab 04/28/18 0514  AST 65*  ALT 81*  ALKPHOS 35*  BILITOT 0.5  PROT 5.9*  ALBUMIN 1.9*   No results for input(s): LIPASE, AMYLASE in the last 168 hours. No results for input(s): AMMONIA in the last 168 hours.  CBC: Recent Labs  Lab 04/28/18 0514  WBC 16.8*  NEUTROABS 12.4*  HGB 9.1*  HCT 28.9*  MCV 96.7  PLT 982*    Cardiac Enzymes: No results for input(s): CKTOTAL,  CKMB, CKMBINDEX, TROPONINI in the last 168 hours.  BNP (last 3 results) No results for input(s): BNP in the last 8760 hours.  ProBNP (last 3 results) No results for input(s): PROBNP in the last 8760 hours.  Radiological Exams: No results found.  Assessment/Plan Active Problems:   Acute on chronic respiratory failure with hypoxia (HCC)   COPD, severe (HCC)   Rheumatoid arthritis (HCC)   Steroid-dependent chronic obstructive pulmonary disease (HCC)   Atrial fibrillation, chronic   1. Acute on chronic respiratory failure with hypoxia we will continue with full vent support.  She is not been able to tolerate the RSB I and so therefore has not been able to wean.  We will continue to assess also suggested increasing the steroids 2. Severe COPD at baseline we will continue with present management 3. Rheumatoid arthritis continue with supportive care 4. Steroid-dependent COPD patient's steroids will be increased as discussed on rounds and discussed with the attending. 5. Chronic atrial fibrillation rate controlled at this time following with cardiology   I have personally seen and evaluated the patient, evaluated laboratory and imaging results, formulated the assessment and plan and placed orders.  Time 35 minutes Advanced review of the chart as well as discussion with the primary care team and rounding team The Patient requires high complexity decision making for  assessment and support.  Case was discussed on Rounds with the Respiratory Therapy Staff  Allyne Gee, MD [facility] Pulmonary Critical Care Medicine Sleep Medicine

## 2018-05-04 DIAGNOSIS — J9621 Acute and chronic respiratory failure with hypoxia: Secondary | ICD-10-CM | POA: Diagnosis not present

## 2018-05-04 DIAGNOSIS — I482 Chronic atrial fibrillation, unspecified: Secondary | ICD-10-CM | POA: Diagnosis not present

## 2018-05-04 DIAGNOSIS — J449 Chronic obstructive pulmonary disease, unspecified: Secondary | ICD-10-CM | POA: Diagnosis not present

## 2018-05-04 DIAGNOSIS — M069 Rheumatoid arthritis, unspecified: Secondary | ICD-10-CM | POA: Diagnosis not present

## 2018-05-04 NOTE — Progress Notes (Signed)
Pulmonary Critical Care Medicine [facility] [place]   PULMONARY CRITICAL CARE SERVICE  PROGRESS NOTE  Date of Service: 05/04/2018  Brittney Lowe  [id number]  DOB: 1951/02/15   DOA: 04/27/2018  Referring Physician: Carron Curie, MD  HPI: Brittney Lowe is a 68 y.o. female seen for follow up of Acute on Chronic Respiratory Failure.  Patient is looking better today she was more calm more relaxed.  She is actually weaning on pressure support now  Medications: Reviewed on Rounds  Physical Exam:  Vitals: Temperature 97.6 pulse 82 respiratory 36 blood pressure 135/69 saturations 94%  Ventilator Settings mode ventilation pressure support FiO2 28% tidal volume 4 2 pressure support 12 PEEP 5  . General: Comfortable at this time . Eyes: Grossly normal lids, irises & conjunctiva . ENT: grossly tongue is normal . Neck: no obvious mass . Cardiovascular: S1 S2 normal no gallop . Respiratory: No rhonchi or rales are noted at this time . Abdomen: soft . Skin: no rash seen on limited exam . Musculoskeletal: not rigid . Psychiatric:unable to assess . Neurologic: no seizure no involuntary movements         Lab Data:   Basic Metabolic Panel: Recent Labs  Lab 04/28/18 0514 04/29/18 1033  NA 131* 130*  K 4.0 4.6  CL 91* 93*  CO2 30 28  GLUCOSE 129* 170*  BUN 10 13  CREATININE 0.33* <0.30*  CALCIUM 8.9 8.7*  MG 1.7 1.7  PHOS 4.0  --     ABG: Recent Labs  Lab 04/27/18 1801  PHART 7.460*  PCO2ART 42.4  PO2ART 132*  HCO3 29.7*  O2SAT 99.4    Liver Function Tests: Recent Labs  Lab 04/28/18 0514  AST 65*  ALT 81*  ALKPHOS 35*  BILITOT 0.5  PROT 5.9*  ALBUMIN 1.9*   No results for input(s): LIPASE, AMYLASE in the last 168 hours. No results for input(s): AMMONIA in the last 168 hours.  CBC: Recent Labs  Lab 04/28/18 0514  WBC 16.8*  NEUTROABS 12.4*  HGB 9.1*  HCT 28.9*  MCV 96.7  PLT 982*    Cardiac Enzymes: No results for  input(s): CKTOTAL, CKMB, CKMBINDEX, TROPONINI in the last 168 hours.  BNP (last 3 results) No results for input(s): BNP in the last 8760 hours.  ProBNP (last 3 results) No results for input(s): PROBNP in the last 8760 hours.  Radiological Exams: No results found.  Assessment/Plan Active Problems:   Acute on chronic respiratory failure with hypoxia (HCC)   COPD, severe (HCC)   Rheumatoid arthritis (HCC)   Steroid-dependent chronic obstructive pulmonary disease (HCC)   Atrial fibrillation, chronic   1. Acute on chronic respiratory failure with hypoxia we will continue with the wean we are going to go as tolerated.  The husband was present he was updated. 2. Severe COPD we increased the steroids yesterday also added Pulmicort today 3. Rheumatoid arthritis continue with present management 4. Steroid-dependent COPD severe disease requiring higher dose steroids 5. Chronic atrial fibrillation rate controlled   I have personally seen and evaluated the patient, evaluated laboratory and imaging results, formulated the assessment and plan and placed orders. The Patient requires high complexity decision making for assessment and support.  Case was discussed on Rounds with the Respiratory Therapy Staff  Yevonne Pax, MD [facility] Pulmonary Critical Care Medicine Sleep Medicine

## 2018-05-05 DIAGNOSIS — I482 Chronic atrial fibrillation, unspecified: Secondary | ICD-10-CM | POA: Diagnosis not present

## 2018-05-05 DIAGNOSIS — J449 Chronic obstructive pulmonary disease, unspecified: Secondary | ICD-10-CM | POA: Diagnosis not present

## 2018-05-05 DIAGNOSIS — M069 Rheumatoid arthritis, unspecified: Secondary | ICD-10-CM | POA: Diagnosis not present

## 2018-05-05 DIAGNOSIS — J9621 Acute and chronic respiratory failure with hypoxia: Secondary | ICD-10-CM | POA: Diagnosis not present

## 2018-05-05 NOTE — Progress Notes (Signed)
Pulmonary Critical Care Medicine [facility] [place]   PULMONARY CRITICAL CARE SERVICE  PROGRESS NOTE  Date of Service: 05/05/2018  LONITA ALBERS  KTG:[id number]  DOB: 1950-05-02   DOA: 04/27/2018  Referring Physician: Carron Curie, MD  HPI: GLENDORIA SERVAIS is a 68 y.o. female seen for follow up of Acute on Chronic Respiratory Failure.  Patient is on pressure support at this time she actually looks more calm and comfortable.  She is doing well with the boost in her steroids  Medications: Reviewed on Rounds  Physical Exam:  Vitals: Temperature 97.6 pulse 82 respiratory 36 blood pressure 114/52 saturations 98%  Ventilator Settings mode ventilation pressure support FiO2 28% pressure support 12 PEEP 5 tidal volume 380  . General: Comfortable at this time . Eyes: Grossly normal lids, irises & conjunctiva . ENT: grossly tongue is normal . Neck: no obvious mass . Cardiovascular: S1 S2 normal no gallop . Respiratory: No rhonchi or rales are noted . Abdomen: soft . Skin: no rash seen on limited exam . Musculoskeletal: not rigid . Psychiatric:unable to assess . Neurologic: no seizure no involuntary movements         Lab Data:   Basic Metabolic Panel: Recent Labs  Lab 04/29/18 1033  NA 130*  K 4.6  CL 93*  CO2 28  GLUCOSE 170*  BUN 13  CREATININE <0.30*  CALCIUM 8.7*  MG 1.7    ABG: No results for input(s): PHART, PCO2ART, PO2ART, HCO3, O2SAT in the last 168 hours.  Liver Function Tests: No results for input(s): AST, ALT, ALKPHOS, BILITOT, PROT, ALBUMIN in the last 168 hours. No results for input(s): LIPASE, AMYLASE in the last 168 hours. No results for input(s): AMMONIA in the last 168 hours.  CBC: No results for input(s): WBC, NEUTROABS, HGB, HCT, MCV, PLT in the last 168 hours.  Cardiac Enzymes: No results for input(s): CKTOTAL, CKMB, CKMBINDEX, TROPONINI in the last 168 hours.  BNP (last 3 results) No results for input(s): BNP in the last  8760 hours.  ProBNP (last 3 results) No results for input(s): PROBNP in the last 8760 hours.  Radiological Exams: No results found.  Assessment/Plan Active Problems:   Acute on chronic respiratory failure with hypoxia (HCC)   COPD, severe (HCC)   Rheumatoid arthritis (HCC)   Steroid-dependent chronic obstructive pulmonary disease (HCC)   Atrial fibrillation, chronic   1. Acute on chronic respiratory failure with hypoxia patient is doing well with the weaning will continue to advance. 2. Severe COPD doing better with nebulizers and medications. 3. Rheumatoid arthritis at baseline we will continue with present management. 4. Steroid dependent COPD currently on high-dose steroids 5. Chronic atrial fibrillation rate is controlled   I have personally seen and evaluated the patient, evaluated laboratory and imaging results, formulated the assessment and plan and placed orders. The Patient requires high complexity decision making for assessment and support.  Case was discussed on Rounds with the Respiratory Therapy Staff  Yevonne Pax, MD [facility] Pulmonary Critical Care Medicine Sleep Medicine

## 2018-05-06 DIAGNOSIS — J449 Chronic obstructive pulmonary disease, unspecified: Secondary | ICD-10-CM | POA: Diagnosis not present

## 2018-05-06 DIAGNOSIS — I482 Chronic atrial fibrillation, unspecified: Secondary | ICD-10-CM | POA: Diagnosis not present

## 2018-05-06 DIAGNOSIS — J9621 Acute and chronic respiratory failure with hypoxia: Secondary | ICD-10-CM | POA: Diagnosis not present

## 2018-05-06 DIAGNOSIS — M069 Rheumatoid arthritis, unspecified: Secondary | ICD-10-CM | POA: Diagnosis not present

## 2018-05-06 LAB — BASIC METABOLIC PANEL
Anion gap: 12 (ref 5–15)
BUN: 20 mg/dL (ref 8–23)
CO2: 33 mmol/L — ABNORMAL HIGH (ref 22–32)
Calcium: 9 mg/dL (ref 8.9–10.3)
Chloride: 91 mmol/L — ABNORMAL LOW (ref 98–111)
Creatinine, Ser: 0.35 mg/dL — ABNORMAL LOW (ref 0.44–1.00)
GFR calc Af Amer: >60 mL/min (ref >60)
Glucose, Bld: 183 mg/dL — ABNORMAL HIGH (ref 70–99)
Potassium: 3.6 mmol/L (ref 3.5–5.1)
SODIUM: 136 mmol/L (ref 135–145)

## 2018-05-06 LAB — CBC
HCT: 28.3 % — ABNORMAL LOW (ref 36.0–46.0)
HEMOGLOBIN: 8.5 g/dL — AB (ref 12.0–15.0)
MCH: 29.5 pg (ref 26.0–34.0)
MCHC: 30 g/dL (ref 30.0–36.0)
MCV: 98.3 fL (ref 80.0–100.0)
Platelets: 1103 10*3/uL (ref 150–400)
RBC: 2.88 MIL/uL — ABNORMAL LOW (ref 3.87–5.11)
RDW: 16.7 % — ABNORMAL HIGH (ref 11.5–15.5)
WBC: 29.4 10*3/uL — ABNORMAL HIGH (ref 4.0–10.5)
nRBC: 0.1 % (ref 0.0–0.2)

## 2018-05-06 LAB — DIGOXIN LEVEL: Digoxin Level: 0.7 ng/mL — ABNORMAL LOW (ref 0.8–2.0)

## 2018-05-06 NOTE — Progress Notes (Signed)
Pulmonary Critical Care Medicine [facility] [place]   PULMONARY CRITICAL CARE SERVICE  PROGRESS NOTE  Date of Service: 05/06/2018  Brittney Lowe  [id number]:[id number]  DOB: 1950/11/29   DOA: 04/27/2018  Referring Physician: Carron Curie, MD  HPI: Brittney Lowe is a 68 y.o. female seen for follow up of Acute on Chronic Respiratory Failure.  Patient is doing well pressure support wean.  Today the goal is to advance to 12 hours  Medications: Reviewed on Rounds  Physical Exam:  Vitals: Temperature 97.6 pulse 78 respiratory 25 blood pressure 118/64 saturations 96%  Ventilator Settings currently on pressure support FiO2 28% tidal volume 610 pressure support 12 PEEP 5  . General: Comfortable at this time . Eyes: Grossly normal lids, irises & conjunctiva . ENT: grossly tongue is normal . Neck: no obvious mass . Cardiovascular: S1 S2 normal no gallop . Respiratory: No rhonchi or rales are noted at this time . Abdomen: soft . Skin: no rash seen on limited exam . Musculoskeletal: not rigid . Psychiatric:unable to assess . Neurologic: no seizure no involuntary movements         Lab Data:   Basic Metabolic Panel: Recent Labs  Lab 05/06/18 0552  NA 136  K 3.6  CL 91*  CO2 33*  GLUCOSE 183*  BUN 20  CREATININE 0.35*  CALCIUM 9.0    ABG: No results for input(s): PHART, PCO2ART, PO2ART, HCO3, O2SAT in the last 168 hours.  Liver Function Tests: No results for input(s): AST, ALT, ALKPHOS, BILITOT, PROT, ALBUMIN in the last 168 hours. No results for input(s): LIPASE, AMYLASE in the last 168 hours. No results for input(s): AMMONIA in the last 168 hours.  CBC: Recent Labs  Lab 05/06/18 0552  WBC 29.4*  HGB 8.5*  HCT 28.3*  MCV 98.3  PLT 1,103*    Cardiac Enzymes: No results for input(s): CKTOTAL, CKMB, CKMBINDEX, TROPONINI in the last 168 hours.  BNP (last 3 results) No results for input(s): BNP in the last 8760 hours.  ProBNP (last 3 results) No  results for input(s): PROBNP in the last 8760 hours.  Radiological Exams: No results found.  Assessment/Plan Active Problems:   Acute on chronic respiratory failure with hypoxia (HCC)   COPD, severe (HCC)   Rheumatoid arthritis (HCC)   Steroid-dependent chronic obstructive pulmonary disease (HCC)   Atrial fibrillation, chronic   1. Acute on chronic respiratory failure with hypoxia we will continue with pressure support mode titrate oxygen as tolerated the goal is 12 hours 2. Severe COPD at baseline continue present management 3. Rheumatoid arthritis stable 4. Steroid-dependent COPD dosages were increased we will continue to monitor 5. Chronic atrial fibrillation rate controlled   I have personally seen and evaluated the patient, evaluated laboratory and imaging results, formulated the assessment and plan and placed orders. The Patient requires high complexity decision making for assessment and support.  Case was discussed on Rounds with the Respiratory Therapy Staff  Yevonne Pax, MD [facility] Pulmonary Critical Care Medicine Sleep Medicine

## 2018-05-07 DIAGNOSIS — J9621 Acute and chronic respiratory failure with hypoxia: Secondary | ICD-10-CM | POA: Diagnosis not present

## 2018-05-07 DIAGNOSIS — I482 Chronic atrial fibrillation, unspecified: Secondary | ICD-10-CM | POA: Diagnosis not present

## 2018-05-07 DIAGNOSIS — M069 Rheumatoid arthritis, unspecified: Secondary | ICD-10-CM | POA: Diagnosis not present

## 2018-05-07 DIAGNOSIS — J449 Chronic obstructive pulmonary disease, unspecified: Secondary | ICD-10-CM | POA: Diagnosis not present

## 2018-05-07 NOTE — Progress Notes (Signed)
Pulmonary Critical Care Medicine [facility] [place]   PULMONARY CRITICAL CARE SERVICE  PROGRESS NOTE  Date of Service: 05/07/2018  Brittney Lowe  [id number]  DOB: 09-01-1950   DOA: 04/27/2018  Referring Physician: Carron Curie, MD  HPI: Brittney Lowe is a 68 y.o. female seen for follow up of Acute on Chronic Respiratory Failure.  Patient is on full support currently on pressure support patient on 28% FiO2 with a PEEP of 5 patient is to do 16 hours on the wean  Medications: Reviewed on Rounds  Physical Exam:  Vitals: Temperature 98.0 pulse 83 respiratory 24 blood pressure 144/85 saturations 95%  Ventilator Settings mode ventilation pressure support FiO2 28% tidal volume 579 PEEP 5 pressure support 12  . General: Comfortable at this time . Eyes: Grossly normal lids, irises & conjunctiva . ENT: grossly tongue is normal . Neck: no obvious mass . Cardiovascular: S1 S2 normal no gallop . Respiratory: No rhonchi or rales are noted at this time . Abdomen: soft . Skin: no rash seen on limited exam . Musculoskeletal: not rigid . Psychiatric:unable to assess . Neurologic: no seizure no involuntary movements         Lab Data:   Basic Metabolic Panel: Recent Labs  Lab 05/06/18 0552  NA 136  K 3.6  CL 91*  CO2 33*  GLUCOSE 183*  BUN 20  CREATININE 0.35*  CALCIUM 9.0    ABG: No results for input(s): PHART, PCO2ART, PO2ART, HCO3, O2SAT in the last 168 hours.  Liver Function Tests: No results for input(s): AST, ALT, ALKPHOS, BILITOT, PROT, ALBUMIN in the last 168 hours. No results for input(s): LIPASE, AMYLASE in the last 168 hours. No results for input(s): AMMONIA in the last 168 hours.  CBC: Recent Labs  Lab 05/06/18 0552  WBC 29.4*  HGB 8.5*  HCT 28.3*  MCV 98.3  PLT 1,103*    Cardiac Enzymes: No results for input(s): CKTOTAL, CKMB, CKMBINDEX, TROPONINI in the last 168 hours.  BNP (last 3 results) No results for input(s): BNP in  the last 8760 hours.  ProBNP (last 3 results) No results for input(s): PROBNP in the last 8760 hours.  Radiological Exams: No results found.  Assessment/Plan Active Problems:   Acute on chronic respiratory failure with hypoxia (HCC)   COPD, severe (HCC)   Rheumatoid arthritis (HCC)   Steroid-dependent chronic obstructive pulmonary disease (HCC)   Atrial fibrillation, chronic   1. Acute on chronic respiratory failure with hypoxia we will continue with full support on the wean protocol.  Continue secretion management pulmonary toilet. 2. Severe COPD at baseline we will continue present therapy 3. Rheumatoid arthritis treated we will continue to monitor 4. Steroid-dependent COPD doing better with the CT elevation and steroids. 5. Chronic atrial fibrillation rate controlled   I have personally seen and evaluated the patient, evaluated laboratory and imaging results, formulated the assessment and plan and placed orders. The Patient requires high complexity decision making for assessment and support.  Case was discussed on Rounds with the Respiratory Therapy Staff  Yevonne Pax, MD [facility] Pulmonary Critical Care Medicine Sleep Medicine

## 2018-05-08 DIAGNOSIS — I482 Chronic atrial fibrillation, unspecified: Secondary | ICD-10-CM | POA: Diagnosis not present

## 2018-05-08 DIAGNOSIS — J449 Chronic obstructive pulmonary disease, unspecified: Secondary | ICD-10-CM | POA: Diagnosis not present

## 2018-05-08 DIAGNOSIS — M069 Rheumatoid arthritis, unspecified: Secondary | ICD-10-CM | POA: Diagnosis not present

## 2018-05-08 DIAGNOSIS — J9621 Acute and chronic respiratory failure with hypoxia: Secondary | ICD-10-CM | POA: Diagnosis not present

## 2018-05-08 LAB — CBC
HCT: 27.2 % — ABNORMAL LOW (ref 36.0–46.0)
Hemoglobin: 7.9 g/dL — ABNORMAL LOW (ref 12.0–15.0)
MCH: 28.3 pg (ref 26.0–34.0)
MCHC: 29 g/dL — ABNORMAL LOW (ref 30.0–36.0)
MCV: 97.5 fL (ref 80.0–100.0)
PLATELETS: 928 10*3/uL — AB (ref 150–400)
RBC: 2.79 MIL/uL — ABNORMAL LOW (ref 3.87–5.11)
RDW: 16.4 % — AB (ref 11.5–15.5)
WBC: 25.3 10*3/uL — ABNORMAL HIGH (ref 4.0–10.5)
nRBC: 0.5 % — ABNORMAL HIGH (ref 0.0–0.2)

## 2018-05-08 LAB — PATHOLOGIST SMEAR REVIEW

## 2018-05-08 NOTE — Progress Notes (Signed)
Pulmonary Critical Care Medicine [facility] [place]   PULMONARY CRITICAL CARE SERVICE  PROGRESS NOTE  Date of Service: 05/08/2018  Brittney Lowe  [id number]  DOB: [birth year]   DOA: 04/27/2018  Referring Physician: Carron Curie, MD  HPI: Brittney Lowe is a 68 y.o. female seen for follow up of Acute on Chronic Respiratory Failure.  Patient is on T collar this morning with a goal of about 2 hours she is doing well so far  Medications: Reviewed on Rounds  Physical Exam:  Vitals: Temperature 98.0 pulse 80 respiratory 23 blood pressure 156/55 saturations 97%  Ventilator Settings currently on T collar FiO2 28%  . General: Comfortable at this time . Eyes: Grossly normal lids, irises & conjunctiva . ENT: grossly tongue is normal . Neck: no obvious mass . Cardiovascular: S1 S2 normal no gallop . Respiratory: No rhonchi or rales are noted . Abdomen: soft . Skin: no rash seen on limited exam . Musculoskeletal: not rigid . Psychiatric:unable to assess . Neurologic: no seizure no involuntary movements         Lab Data:   Basic Metabolic Panel: Recent Labs  Lab 05/06/18 0552  NA 136  K 3.6  CL 91*  CO2 33*  GLUCOSE 183*  BUN 20  CREATININE 0.35*  CALCIUM 9.0    ABG: No results for input(s): PHART, PCO2ART, PO2ART, HCO3, O2SAT in the last 168 hours.  Liver Function Tests: No results for input(s): AST, ALT, ALKPHOS, BILITOT, PROT, ALBUMIN in the last 168 hours. No results for input(s): LIPASE, AMYLASE in the last 168 hours. No results for input(s): AMMONIA in the last 168 hours.  CBC: Recent Labs  Lab 05/06/18 0552 05/08/18 0714  WBC 29.4* 25.3*  HGB 8.5* 7.9*  HCT 28.3* 27.2*  MCV 98.3 97.5  PLT 1,103* 928*    Cardiac Enzymes: No results for input(s): CKTOTAL, CKMB, CKMBINDEX, TROPONINI in the last 168 hours.  BNP (last 3 results) No results for input(s): BNP in the last 8760 hours.  ProBNP (last 3 results) No results for  input(s): PROBNP in the last 8760 hours.  Radiological Exams: No results found.  Assessment/Plan Active Problems:   Acute on chronic respiratory failure with hypoxia (HCC)   COPD, severe (HCC)   Rheumatoid arthritis (HCC)   Steroid-dependent chronic obstructive pulmonary disease (HCC)   Atrial fibrillation, chronic   1. Acute on chronic respiratory failure with hypoxia we will continue with T collar trials titrate oxygen continue pulmonary toilet 2. Severe COPD on steroids continue present therapy 3. Rheumatoid arthritis at baseline 4. Steroid-dependent COPD on slow prednisone taper 5. Chronic atrial fibrillation rate controlled   I have personally seen and evaluated the patient, evaluated laboratory and imaging results, formulated the assessment and plan and placed orders. The Patient requires high complexity decision making for assessment and support.  Case was discussed on Rounds with the Respiratory Therapy Staff  Yevonne Pax, MD [facility] Pulmonary Critical Care Medicine Sleep Medicine

## 2018-05-09 DIAGNOSIS — M069 Rheumatoid arthritis, unspecified: Secondary | ICD-10-CM | POA: Diagnosis not present

## 2018-05-09 DIAGNOSIS — J9621 Acute and chronic respiratory failure with hypoxia: Secondary | ICD-10-CM | POA: Diagnosis not present

## 2018-05-09 DIAGNOSIS — I482 Chronic atrial fibrillation, unspecified: Secondary | ICD-10-CM | POA: Diagnosis not present

## 2018-05-09 DIAGNOSIS — J449 Chronic obstructive pulmonary disease, unspecified: Secondary | ICD-10-CM | POA: Diagnosis not present

## 2018-05-09 NOTE — Progress Notes (Addendum)
Pulmonary Critical Care Medicine [facility] [place]   PULMONARY CRITICAL CARE SERVICE  PROGRESS NOTE  Date of Service: 05/09/2018  Brittney Lowe  [id number]  DOB: 11/28/1950   DOA: 04/27/2018  Referring Physician: Carron Curie, MD  HPI: Brittney Lowe is a 68 y.o. female seen for follow up of Acute on Chronic Respiratory Failure.  Patient remains on trach collar with a goal of 4 hours today.  She was able to do this with minimal difficulty.  She was placed back on pressure support.    Medications: Reviewed on Rounds  Physical Exam:  Vitals: Pulse 61 respirations 23 blood pressure 151/74 O2 sat 95% temp 97.0  Ventilator Settings ventilator mode pressure support FiO2 38% 12/5  . General: Comfortable at this time . Eyes: Grossly normal lids, irises & conjunctiva . ENT: grossly tongue is normal . Neck: no obvious mass . Cardiovascular: S1 S2 normal no gallop . Respiratory: No rhonchi or rales noted . Abdomen: soft . Skin: no rash seen on limited exam . Musculoskeletal: not rigid . Psychiatric:unable to assess . Neurologic: no seizure no involuntary movements         Lab Data:   Basic Metabolic Panel: Recent Labs  Lab 05/06/18 0552  NA 136  K 3.6  CL 91*  CO2 33*  GLUCOSE 183*  BUN 20  CREATININE 0.35*  CALCIUM 9.0    ABG: No results for input(s): PHART, PCO2ART, PO2ART, HCO3, O2SAT in the last 168 hours.  Liver Function Tests: No results for input(s): AST, ALT, ALKPHOS, BILITOT, PROT, ALBUMIN in the last 168 hours. No results for input(s): LIPASE, AMYLASE in the last 168 hours. No results for input(s): AMMONIA in the last 168 hours.  CBC: Recent Labs  Lab 05/06/18 0552 05/08/18 0714  WBC 29.4* 25.3*  HGB 8.5* 7.9*  HCT 28.3* 27.2*  MCV 98.3 97.5  PLT 1,103* 928*    Cardiac Enzymes: No results for input(s): CKTOTAL, CKMB, CKMBINDEX, TROPONINI in the last 168 hours.  BNP (last 3 results) No results for input(s): BNP in the  last 8760 hours.  ProBNP (last 3 results) No results for input(s): PROBNP in the last 8760 hours.  Radiological Exams: No results found.  Assessment/Plan Active Problems:   Acute on chronic respiratory failure with hypoxia (HCC)   COPD, severe (HCC)   Rheumatoid arthritis (HCC)   Steroid-dependent chronic obstructive pulmonary disease (HCC)   Atrial fibrillation, chronic   1. Acute on chronic respiratory failure with hypoxia continue with trach collar trials as tolerated.  Continue pulmonary toilet. 2. Severe COPD on steroids continue present therapy 3. Rheumatoid arthritis at baseline 4. Steroid-dependent COPD on slow prednisone taper 5. Chronic atrial fibrillation rate controlled   I have personally seen and evaluated the patient, evaluated laboratory and imaging results, formulated the assessment and plan and placed orders. The Patient requires high complexity decision making for assessment and support.  Case was discussed on Rounds with the Respiratory Therapy Staff  Yevonne Pax, MD [facility] Pulmonary Critical Care Medicine Sleep Medicine

## 2018-05-10 DIAGNOSIS — J9621 Acute and chronic respiratory failure with hypoxia: Secondary | ICD-10-CM | POA: Diagnosis not present

## 2018-05-10 DIAGNOSIS — M069 Rheumatoid arthritis, unspecified: Secondary | ICD-10-CM | POA: Diagnosis not present

## 2018-05-10 DIAGNOSIS — I482 Chronic atrial fibrillation, unspecified: Secondary | ICD-10-CM | POA: Diagnosis not present

## 2018-05-10 DIAGNOSIS — J449 Chronic obstructive pulmonary disease, unspecified: Secondary | ICD-10-CM | POA: Diagnosis not present

## 2018-05-10 LAB — MAGNESIUM: Magnesium: 2.1 mg/dL (ref 1.7–2.4)

## 2018-05-10 NOTE — Progress Notes (Signed)
Pulmonary Critical Care Medicine [facility] [place]   PULMONARY CRITICAL CARE SERVICE  PROGRESS NOTE  Date of Service: 05/10/2018  Brittney Lowe  [id number]  DOB: 11-30-1950   DOA: 04/27/2018  Referring Physician: Carron Curie, MD  HPI: Brittney Lowe is a 67 y.o. female seen for follow up of Acute on Chronic Respiratory Failure.  Patient is doing well she has been on T collar 28% FiO2  Medications: Reviewed on Rounds  Physical Exam:  Vitals: Temperature 96.4 pulse 66 respiratory 24 blood pressure 137/78 saturations 97%  Ventilator Settings off the ventilator on T collar  . General: Comfortable at this time . Eyes: Grossly normal lids, irises & conjunctiva . ENT: grossly tongue is normal . Neck: no obvious mass . Cardiovascular: S1 S2 normal no gallop . Respiratory: No rhonchi or rales are noted . Abdomen: soft . Skin: no rash seen on limited exam . Musculoskeletal: not rigid . Psychiatric:unable to assess . Neurologic: no seizure no involuntary movements         Lab Data:   Basic Metabolic Panel: Recent Labs  Lab 05/06/18 0552  NA 136  K 3.6  CL 91*  CO2 33*  GLUCOSE 183*  BUN 20  CREATININE 0.35*  CALCIUM 9.0    ABG: No results for input(s): PHART, PCO2ART, PO2ART, HCO3, O2SAT in the last 168 hours.  Liver Function Tests: No results for input(s): AST, ALT, ALKPHOS, BILITOT, PROT, ALBUMIN in the last 168 hours. No results for input(s): LIPASE, AMYLASE in the last 168 hours. No results for input(s): AMMONIA in the last 168 hours.  CBC: Recent Labs  Lab 05/06/18 0552 05/08/18 0714  WBC 29.4* 25.3*  HGB 8.5* 7.9*  HCT 28.3* 27.2*  MCV 98.3 97.5  PLT 1,103* 928*    Cardiac Enzymes: No results for input(s): CKTOTAL, CKMB, CKMBINDEX, TROPONINI in the last 168 hours.  BNP (last 3 results) No results for input(s): BNP in the last 8760 hours.  ProBNP (last 3 results) No results for input(s): PROBNP in the last 8760  hours.  Radiological Exams: No results found.  Assessment/Plan Active Problems:   Acute on chronic respiratory failure with hypoxia (HCC)   COPD, severe (HCC)   Rheumatoid arthritis (HCC)   Steroid-dependent chronic obstructive pulmonary disease (HCC)   Atrial fibrillation, chronic   1. Acute on chronic respiratory failure with hypoxia continue with T collar titrate oxygen continue pulmonary toilet 2. Severe COPD at baseline 3. Rheumatoid arthritis continue to monitor 4. Steroid dependent continue with steroids as ordered 5. Chronic atrial fibrillation rate controlled   I have personally seen and evaluated the patient, evaluated laboratory and imaging results, formulated the assessment and plan and placed orders. The Patient requires high complexity decision making for assessment and support.  Case was discussed on Rounds with the Respiratory Therapy Staff  Yevonne Pax, MD [facility] Pulmonary Critical Care Medicine Sleep Medicine

## 2018-05-11 DIAGNOSIS — M069 Rheumatoid arthritis, unspecified: Secondary | ICD-10-CM | POA: Diagnosis not present

## 2018-05-11 DIAGNOSIS — J449 Chronic obstructive pulmonary disease, unspecified: Secondary | ICD-10-CM | POA: Diagnosis not present

## 2018-05-11 DIAGNOSIS — J9621 Acute and chronic respiratory failure with hypoxia: Secondary | ICD-10-CM | POA: Diagnosis not present

## 2018-05-11 DIAGNOSIS — I482 Chronic atrial fibrillation, unspecified: Secondary | ICD-10-CM | POA: Diagnosis not present

## 2018-05-11 NOTE — Progress Notes (Addendum)
Pulmonary Critical Care Medicine [facility] [place]   PULMONARY CRITICAL CARE SERVICE  PROGRESS NOTE  Date of Service: 05/11/2018  Brittney Lowe  [id number]  DOB: 09-Jan-1951   DOA: 04/27/2018  Referring Physician: Carron Curie, MD  HPI: Brittney Lowe is a 68 y.o. female seen for follow up of Acute on Chronic Respiratory Failure.  Patient continues to do well on trach collar.  She was able to tolerate 12 hours yesterday with a goal of 16 hours today.  Her current FiO2 is 28%  Medications: Reviewed on Rounds  Physical Exam:  Vitals: Pulse 71 respirations 23 blood pressure 120/54 O2 sat 99% temp 97.2  Ventilator Settings not currently on ventilator  . General: Comfortable at this time . Eyes: Grossly normal lids, irises & conjunctiva . ENT: grossly tongue is normal . Neck: no obvious mass . Cardiovascular: S1 S2 normal no gallop . Respiratory: No wheezes rhonchi noted . Abdomen: soft . Skin: no rash seen on limited exam . Musculoskeletal: not rigid . Psychiatric:unable to assess . Neurologic: no seizure no involuntary movements         Lab Data:   Basic Metabolic Panel: Recent Labs  Lab 05/06/18 0552 05/10/18 1608  NA 136  --   K 3.6  --   CL 91*  --   CO2 33*  --   GLUCOSE 183*  --   BUN 20  --   CREATININE 0.35*  --   CALCIUM 9.0  --   MG  --  2.1    ABG: No results for input(s): PHART, PCO2ART, PO2ART, HCO3, O2SAT in the last 168 hours.  Liver Function Tests: No results for input(s): AST, ALT, ALKPHOS, BILITOT, PROT, ALBUMIN in the last 168 hours. No results for input(s): LIPASE, AMYLASE in the last 168 hours. No results for input(s): AMMONIA in the last 168 hours.  CBC: Recent Labs  Lab 05/06/18 0552 05/08/18 0714  WBC 29.4* 25.3*  HGB 8.5* 7.9*  HCT 28.3* 27.2*  MCV 98.3 97.5  PLT 1,103* 928*    Cardiac Enzymes: No results for input(s): CKTOTAL, CKMB, CKMBINDEX, TROPONINI in the last 168 hours.  BNP (last 3  results) No results for input(s): BNP in the last 8760 hours.  ProBNP (last 3 results) No results for input(s): PROBNP in the last 8760 hours.  Radiological Exams: No results found.  Assessment/Plan Active Problems:   Acute on chronic respiratory failure with hypoxia (HCC)   COPD, severe (HCC)   Rheumatoid arthritis (HCC)   Steroid-dependent chronic obstructive pulmonary disease (HCC)   Atrial fibrillation, chronic   1. Acute on chronic respiratory failure with hypoxia continue with trach collar titration as well as oxygen.  Continue pulmonary toilet. 2. Severe COPD at baseline 3. Rheumatoid arthritis continue to monitor 4. Steroid dependent continue with steroids as ordered 5. Chronic atrial fibrillation rate controlled   I have personally seen and evaluated the patient, evaluated laboratory and imaging results, formulated the assessment and plan and placed orders. The Patient requires high complexity decision making for assessment and support.  Case was discussed on Rounds with the Respiratory Therapy Staff  Yevonne Pax, MD [facility] Pulmonary Critical Care Medicine Sleep Medicine

## 2018-05-12 ENCOUNTER — Other Ambulatory Visit (HOSPITAL_COMMUNITY): Payer: Medicare HMO

## 2018-05-12 DIAGNOSIS — M069 Rheumatoid arthritis, unspecified: Secondary | ICD-10-CM | POA: Diagnosis not present

## 2018-05-12 DIAGNOSIS — J9621 Acute and chronic respiratory failure with hypoxia: Secondary | ICD-10-CM | POA: Diagnosis not present

## 2018-05-12 DIAGNOSIS — J449 Chronic obstructive pulmonary disease, unspecified: Secondary | ICD-10-CM | POA: Diagnosis not present

## 2018-05-12 DIAGNOSIS — I482 Chronic atrial fibrillation, unspecified: Secondary | ICD-10-CM | POA: Diagnosis not present

## 2018-05-12 LAB — URINALYSIS, ROUTINE W REFLEX MICROSCOPIC
Bilirubin Urine: NEGATIVE
Glucose, UA: NEGATIVE mg/dL
HGB URINE DIPSTICK: NEGATIVE
Ketones, ur: NEGATIVE mg/dL
Nitrite: NEGATIVE
Protein, ur: NEGATIVE mg/dL
Specific Gravity, Urine: 1.015 (ref 1.005–1.030)
WBC, UA: >50 WBC/hpf — ABNORMAL HIGH (ref 0–5)
pH: 7 (ref 5.0–8.0)

## 2018-05-12 LAB — COMPREHENSIVE METABOLIC PANEL
ALBUMIN: 2.1 g/dL — AB (ref 3.5–5.0)
ALT: 71 U/L — ABNORMAL HIGH (ref 0–44)
AST: 54 U/L — ABNORMAL HIGH (ref 15–41)
Alkaline Phosphatase: 26 U/L — ABNORMAL LOW (ref 38–126)
Anion gap: 9 (ref 5–15)
BUN: 24 mg/dL — ABNORMAL HIGH (ref 8–23)
CHLORIDE: 101 mmol/L (ref 98–111)
CO2: 29 mmol/L (ref 22–32)
Calcium: 8.3 mg/dL — ABNORMAL LOW (ref 8.9–10.3)
Creatinine, Ser: <0.3 mg/dL — ABNORMAL LOW (ref 0.44–1.00)
GLUCOSE: 117 mg/dL — AB (ref 70–99)
Potassium: 4.2 mmol/L (ref 3.5–5.1)
SODIUM: 139 mmol/L (ref 135–145)
Total Bilirubin: 1.2 mg/dL (ref 0.3–1.2)
Total Protein: 4.8 g/dL — ABNORMAL LOW (ref 6.5–8.1)

## 2018-05-12 LAB — CBC
HCT: 29.2 % — ABNORMAL LOW (ref 36.0–46.0)
Hemoglobin: 8.5 g/dL — ABNORMAL LOW (ref 12.0–15.0)
MCH: 27.9 pg (ref 26.0–34.0)
MCHC: 29.1 g/dL — ABNORMAL LOW (ref 30.0–36.0)
MCV: 95.7 fL (ref 80.0–100.0)
PLATELETS: 790 10*3/uL — AB (ref 150–400)
RBC: 3.05 MIL/uL — ABNORMAL LOW (ref 3.87–5.11)
RDW: 16.6 % — ABNORMAL HIGH (ref 11.5–15.5)
WBC: 28.9 10*3/uL — ABNORMAL HIGH (ref 4.0–10.5)
nRBC: 0.2 % (ref 0.0–0.2)

## 2018-05-12 NOTE — Progress Notes (Addendum)
[facility] [place]   PULMONARY CRITICAL CARE SERVICE  PROGRESS NOTE  Date of Service: 05/12/2018  Brittney GoodellDeborah A Lowe  [id number]  DOB: 1950/07/21   DOA: 04/27/2018  Referring Physician: Carron CurieAli Hijazi, MD  HPI: Brittney Lowe is a 68 y.o. female seen for follow up of Acute on Chronic Respiratory Failure.  Patient is doing well on trach collar.  She was able to tolerate 16 hours yesterday and is continuing today with a goal of 20 hours.  Her current FiO2 is 28%.  Medications: Reviewed on Rounds  Physical Exam:  Vitals: Pulse 71 respirations 19 blood pressure 131/59 O2 sat 97% temp 96.4  Ventilator Settings patient's not currently on ventilator  . General: Comfortable at this time . Eyes: Grossly normal lids, irises & conjunctiva . ENT: grossly tongue is normal . Neck: no obvious mass . Cardiovascular: S1 S2 normal no gallop . Respiratory: No rales or rhonchi noted . Abdomen: soft . Skin: no rash seen on limited exam . Musculoskeletal: not rigid . Psychiatric:unable to assess . Neurologic: no seizure no involuntary movements         Lab Data:   Basic Metabolic Panel: Recent Labs  Lab 05/06/18 0552 05/10/18 1608 05/12/18 0530  NA 136  --  139  K 3.6  --  4.2  CL 91*  --  101  CO2 33*  --  29  GLUCOSE 183*  --  117*  BUN 20  --  24*  CREATININE 0.35*  --  <0.30*  CALCIUM 9.0  --  8.3*  MG  --  2.1  --     ABG: No results for input(s): PHART, PCO2ART, PO2ART, HCO3, O2SAT in the last 168 hours.  Liver Function Tests: Recent Labs  Lab 05/12/18 0530  AST 54*  ALT 71*  ALKPHOS 26*  BILITOT 1.2  PROT 4.8*  ALBUMIN 2.1*   No results for input(s): LIPASE, AMYLASE in the last 168 hours. No results for input(s): AMMONIA in the last 168 hours.  CBC: Recent Labs  Lab 05/06/18 0552 05/08/18 0714 05/12/18 0530  WBC 29.4* 25.3* 28.9*  HGB 8.5* 7.9* 8.5*  HCT 28.3* 27.2* 29.2*  MCV 98.3 97.5 95.7  PLT  1,103* 928* 790*    Cardiac Enzymes: No results for input(s): CKTOTAL, CKMB, CKMBINDEX, TROPONINI in the last 168 hours.  BNP (last 3 results) No results for input(s): BNP in the last 8760 hours.  ProBNP (last 3 results) No results for input(s): PROBNP in the last 8760 hours.  Radiological Exams: Dg Chest Port 1 View  Result Date: 05/12/2018 CLINICAL DATA:  Acute respiratory failure. EXAM: PORTABLE CHEST 1 VIEW COMPARISON:  04/27/2018 FINDINGS: The tracheostomy tube tip is above the carina. Aortic atherosclerosis. Normal heart size. No pleural effusion or edema identified. The lungs are hyperinflated but clear. Coarsened interstitial markings of emphysema. IMPRESSION: 1. No acute cardiopulmonary abnormality noted. 2. Aortic Atherosclerosis (ICD10-I70.0) and Emphysema (ICD10-J43.9). Electronically Signed   By: Signa Kellaylor  Stroud M.D.   On: 05/12/2018 08:06    Assessment/Plan Active Problems:   Acute on chronic respiratory failure with hypoxia (HCC)   COPD, severe (HCC)   Rheumatoid arthritis (HCC)   Steroid-dependent chronic obstructive pulmonary disease (HCC)   Atrial fibrillation, chronic   1. Acute on chronic respiratory failure with hypoxia continue trach collar titration oxygen weaning as ordered per protocol.  Continue pulmonary toilet. 2. Severe COPD at baseline 3. Rheumatoid arthritis continue to monitor 4. Steroid dependent continue with steroids  as ordered 5. Chronic atrial fibrillation rate controlled   I have personally seen and evaluated the patient, evaluated laboratory and imaging results, formulated the assessment and plan and placed orders. The Patient requires high complexity decision making for assessment and support.  Case was discussed on Rounds with the Respiratory Therapy Staff  Yevonne Pax, MD [facility] Pulmonary Critical Care Medicine Sleep Medicine

## 2018-05-13 DIAGNOSIS — I482 Chronic atrial fibrillation, unspecified: Secondary | ICD-10-CM | POA: Diagnosis not present

## 2018-05-13 DIAGNOSIS — M069 Rheumatoid arthritis, unspecified: Secondary | ICD-10-CM | POA: Diagnosis not present

## 2018-05-13 DIAGNOSIS — J9621 Acute and chronic respiratory failure with hypoxia: Secondary | ICD-10-CM | POA: Diagnosis not present

## 2018-05-13 DIAGNOSIS — J449 Chronic obstructive pulmonary disease, unspecified: Secondary | ICD-10-CM | POA: Diagnosis not present

## 2018-05-13 LAB — URINE CULTURE

## 2018-05-13 NOTE — Progress Notes (Signed)
Pulmonary Critical Care Medicine [facility] [place]   PULMONARY CRITICAL CARE SERVICE  PROGRESS NOTE  Date of Service: 05/13/2018  Brittney Lowe  [id number]  DOB: 09/15/1950   DOA: 04/27/2018  Referring Physician: Carron Curie, MD  HPI: Brittney Lowe is a 68 y.o. female seen for follow up of Acute on Chronic Respiratory Failure.  At this time patient is on T collar has been off the ventilator for more than 24 hours doing very well  Medications: Reviewed on Rounds  Physical Exam:  Vitals: Temperature 97.7 pulse 66 respiratory 29 blood pressure 154/55 saturations 97%  Ventilator Settings patient is on T collar FiO2 28%  . General: Comfortable at this time . Eyes: Grossly normal lids, irises & conjunctiva . ENT: grossly tongue is normal . Neck: no obvious mass . Cardiovascular: S1 S2 normal no gallop . Respiratory: Scattered rhonchi expansion is equal . Abdomen: soft . Skin: no rash seen on limited exam . Musculoskeletal: not rigid . Psychiatric:unable to assess . Neurologic: no seizure no involuntary movements         Lab Data:   Basic Metabolic Panel: Recent Labs  Lab 05/10/18 1608 05/12/18 0530  NA  --  139  K  --  4.2  CL  --  101  CO2  --  29  GLUCOSE  --  117*  BUN  --  24*  CREATININE  --  <0.30*  CALCIUM  --  8.3*  MG 2.1  --     ABG: No results for input(s): PHART, PCO2ART, PO2ART, HCO3, O2SAT in the last 168 hours.  Liver Function Tests: Recent Labs  Lab 05/12/18 0530  AST 54*  ALT 71*  ALKPHOS 26*  BILITOT 1.2  PROT 4.8*  ALBUMIN 2.1*   No results for input(s): LIPASE, AMYLASE in the last 168 hours. No results for input(s): AMMONIA in the last 168 hours.  CBC: Recent Labs  Lab 05/08/18 0714 05/12/18 0530  WBC 25.3* 28.9*  HGB 7.9* 8.5*  HCT 27.2* 29.2*  MCV 97.5 95.7  PLT 928* 790*    Cardiac Enzymes: No results for input(s): CKTOTAL, CKMB, CKMBINDEX, TROPONINI in the last 168 hours.  BNP (last 3  results) No results for input(s): BNP in the last 8760 hours.  ProBNP (last 3 results) No results for input(s): PROBNP in the last 8760 hours.  Radiological Exams: Dg Chest Port 1 View  Result Date: 05/12/2018 CLINICAL DATA:  Acute respiratory failure. EXAM: PORTABLE CHEST 1 VIEW COMPARISON:  04/27/2018 FINDINGS: The tracheostomy tube tip is above the carina. Aortic atherosclerosis. Normal heart size. No pleural effusion or edema identified. The lungs are hyperinflated but clear. Coarsened interstitial markings of emphysema. IMPRESSION: 1. No acute cardiopulmonary abnormality noted. 2. Aortic Atherosclerosis (ICD10-I70.0) and Emphysema (ICD10-J43.9). Electronically Signed   By: Signa Kell M.D.   On: 05/12/2018 08:06    Assessment/Plan Active Problems:   Acute on chronic respiratory failure with hypoxia (HCC)   COPD, severe (HCC)   Rheumatoid arthritis (HCC)   Steroid-dependent chronic obstructive pulmonary disease (HCC)   Atrial fibrillation, chronic   1. Acute on chronic respiratory failure with hypoxia we will continue with the weaning process on T collar continue supportive care 2. Severe COPD at baseline now 3. Rheumatoid arthritis treated 4. Steroid dependence continue with prednisone 5. Chronic atrial fibrillation rate controlled   I have personally seen and evaluated the patient, evaluated laboratory and imaging results, formulated the assessment and plan and placed orders. The Patient requires  high complexity decision making for assessment and support.  Case was discussed on Rounds with the Respiratory Therapy Staff  Allyne Gee, MD [facility] Pulmonary Critical Care Medicine Sleep Medicine

## 2018-05-14 DIAGNOSIS — J449 Chronic obstructive pulmonary disease, unspecified: Secondary | ICD-10-CM | POA: Diagnosis not present

## 2018-05-14 DIAGNOSIS — I482 Chronic atrial fibrillation, unspecified: Secondary | ICD-10-CM | POA: Diagnosis not present

## 2018-05-14 DIAGNOSIS — J9621 Acute and chronic respiratory failure with hypoxia: Secondary | ICD-10-CM | POA: Diagnosis not present

## 2018-05-14 DIAGNOSIS — M069 Rheumatoid arthritis, unspecified: Secondary | ICD-10-CM | POA: Diagnosis not present

## 2018-05-14 NOTE — Progress Notes (Addendum)
Pulmonary Critical Care Medicine [facility] [place]   PULMONARY CRITICAL CARE SERVICE  PROGRESS NOTE  Date of Service: 05/14/2018  Brittney Lowe  [id number]  DOB: 11/08/1950   DOA: 04/27/2018  Referring Physician: Carron Curie, MD  HPI: Brittney Lowe is a 68 y.o. female seen for follow up of Acute on Chronic Respiratory Failure.  Patient is doing well on trach collar 28% FiO2.  Has been on trach collar for 24 hours and is headed into 48-hour challenge.  Patient's trach was downgraded today from 8 to #6 cuffless.  Using PMV well  Medications: Reviewed on Rounds  Physical Exam:  Vitals: Pulse 76 respirations 22 blood pressure 137/49 O2 sat 96% temp 97.2  Ventilator Settings patient's not currently on ventilator  . General: Comfortable at this time . Eyes: Grossly normal lids, irises & conjunctiva . ENT: grossly tongue is normal . Neck: no obvious mass . Cardiovascular: S1 S2 normal no gallop . Respiratory: No rales or rhonchi noted . Abdomen: soft . Skin: no rash seen on limited exam . Musculoskeletal: not rigid . Psychiatric:unable to assess . Neurologic: no seizure no involuntary movements         Lab Data:   Basic Metabolic Panel: Recent Labs  Lab 05/10/18 1608 05/12/18 0530  NA  --  139  K  --  4.2  CL  --  101  CO2  --  29  GLUCOSE  --  117*  BUN  --  24*  CREATININE  --  <0.30*  CALCIUM  --  8.3*  MG 2.1  --     ABG: No results for input(s): PHART, PCO2ART, PO2ART, HCO3, O2SAT in the last 168 hours.  Liver Function Tests: Recent Labs  Lab 05/12/18 0530  AST 54*  ALT 71*  ALKPHOS 26*  BILITOT 1.2  PROT 4.8*  ALBUMIN 2.1*   No results for input(s): LIPASE, AMYLASE in the last 168 hours. No results for input(s): AMMONIA in the last 168 hours.  CBC: Recent Labs  Lab 05/08/18 0714 05/12/18 0530  WBC 25.3* 28.9*  HGB 7.9* 8.5*  HCT 27.2* 29.2*  MCV 97.5 95.7  PLT 928* 790*    Cardiac Enzymes: No results for  input(s): CKTOTAL, CKMB, CKMBINDEX, TROPONINI in the last 168 hours.  BNP (last 3 results) No results for input(s): BNP in the last 8760 hours.  ProBNP (last 3 results) No results for input(s): PROBNP in the last 8760 hours.  Radiological Exams: No results found.  Assessment/Plan Active Problems:   Acute on chronic respiratory failure with hypoxia (HCC)   COPD, severe (HCC)   Rheumatoid arthritis (HCC)   Steroid-dependent chronic obstructive pulmonary disease (HCC)   Atrial fibrillation, chronic   1. Acute on chronic respiratory failure with hypoxia continue with weaning process as tolerated. 2. Severe COPD at baseline 3. Rheumatoid arthritis treated 4. Steroid dependence continue prednisone 5. Chronic atrial fibrillation rate controlled   I have personally seen and evaluated the patient, evaluated laboratory and imaging results, formulated the assessment and plan and placed orders. The Patient requires high complexity decision making for assessment and support.  Case was discussed on Rounds with the Respiratory Therapy Staff  Yevonne Pax, MD [facility] Pulmonary Critical Care Medicine Sleep Medicine

## 2018-05-15 DIAGNOSIS — M069 Rheumatoid arthritis, unspecified: Secondary | ICD-10-CM | POA: Diagnosis not present

## 2018-05-15 DIAGNOSIS — J449 Chronic obstructive pulmonary disease, unspecified: Secondary | ICD-10-CM | POA: Diagnosis not present

## 2018-05-15 DIAGNOSIS — I482 Chronic atrial fibrillation, unspecified: Secondary | ICD-10-CM | POA: Diagnosis not present

## 2018-05-15 DIAGNOSIS — J9621 Acute and chronic respiratory failure with hypoxia: Secondary | ICD-10-CM | POA: Diagnosis not present

## 2018-05-15 NOTE — Progress Notes (Addendum)
Pulmonary Critical Care Medicine [facility] [place]   PULMONARY CRITICAL CARE SERVICE  PROGRESS NOTE  Date of Service: 05/15/2018  Brittney Lowe  [id number]  DOB: November 28, 1950   DOA: 04/27/2018  Referring Physician: Carron Curie, MD  HPI: Brittney Lowe is a 68 y.o. female seen for follow up of Acute on Chronic Respiratory Failure.  Patient is doing well has been 48 hours off of ventilator.  Trach collar remains FiO2 28%.  Using PMV without difficulty.  Medications: Reviewed on Rounds  Physical Exam:  Vitals: Pulse 76 respirations 28 BP 131/74 O2 sat 99% temp 96.9  Ventilator Settings patient's not currently on ventilator  . General: Comfortable at this time . Eyes: Grossly normal lids, irises & conjunctiva . ENT: grossly tongue is normal . Neck: no obvious mass . Cardiovascular: S1 S2 normal no gallop . Respiratory: No rales or rhonchi noted . Abdomen: soft . Skin: no rash seen on limited exam . Musculoskeletal: not rigid . Psychiatric:unable to assess . Neurologic: no seizure no involuntary movements         Lab Data:   Basic Metabolic Panel: Recent Labs  Lab 05/10/18 1608 05/12/18 0530  NA  --  139  K  --  4.2  CL  --  101  CO2  --  29  GLUCOSE  --  117*  BUN  --  24*  CREATININE  --  <0.30*  CALCIUM  --  8.3*  MG 2.1  --     ABG: No results for input(s): PHART, PCO2ART, PO2ART, HCO3, O2SAT in the last 168 hours.  Liver Function Tests: Recent Labs  Lab 05/12/18 0530  AST 54*  ALT 71*  ALKPHOS 26*  BILITOT 1.2  PROT 4.8*  ALBUMIN 2.1*   No results for input(s): LIPASE, AMYLASE in the last 168 hours. No results for input(s): AMMONIA in the last 168 hours.  CBC: Recent Labs  Lab 05/12/18 0530  WBC 28.9*  HGB 8.5*  HCT 29.2*  MCV 95.7  PLT 790*    Cardiac Enzymes: No results for input(s): CKTOTAL, CKMB, CKMBINDEX, TROPONINI in the last 168 hours.  BNP (last 3 results) No results for input(s): BNP in the last  8760 hours.  ProBNP (last 3 results) No results for input(s): PROBNP in the last 8760 hours.  Radiological Exams: No results found.  Assessment/Plan Active Problems:   Acute on chronic respiratory failure with hypoxia (HCC)   COPD, severe (HCC)   Rheumatoid arthritis (HCC)   Steroid-dependent chronic obstructive pulmonary disease (HCC)   Atrial fibrillation, chronic   1. Acute on chronic respiratory failure with hypoxia continue weaning as tolerated 2. Severe COPD at baseline 3. Rheumatoid arthritis treated 4. Steroid dependence continue prednisone 5. Chronic atrial fibrillation rate controlled   I have personally seen and evaluated the patient, evaluated laboratory and imaging results, formulated the assessment and plan and placed orders. The Patient requires high complexity decision making for assessment and support.  Case was discussed on Rounds with the Respiratory Therapy Staff  Yevonne Pax, MD [facility] Pulmonary Critical Care Medicine Sleep Medicine

## 2018-05-16 ENCOUNTER — Other Ambulatory Visit (HOSPITAL_COMMUNITY): Payer: Medicare HMO

## 2018-05-16 DIAGNOSIS — I482 Chronic atrial fibrillation, unspecified: Secondary | ICD-10-CM | POA: Diagnosis not present

## 2018-05-16 DIAGNOSIS — M069 Rheumatoid arthritis, unspecified: Secondary | ICD-10-CM | POA: Diagnosis not present

## 2018-05-16 DIAGNOSIS — J9621 Acute and chronic respiratory failure with hypoxia: Secondary | ICD-10-CM | POA: Diagnosis not present

## 2018-05-16 DIAGNOSIS — J449 Chronic obstructive pulmonary disease, unspecified: Secondary | ICD-10-CM | POA: Diagnosis not present

## 2018-05-16 NOTE — Progress Notes (Signed)
Pulmonary Critical Care Medicine [facility] [place]   PULMONARY CRITICAL CARE SERVICE  PROGRESS NOTE  Date of Service: 05/16/2018  Brittney GLAUSER  [id number]  DOB: [birth year]   DOA: 04/27/2018  Referring Physician: Carron Curie, MD  HPI: Brittney Lowe is a 68 y.o. female seen for follow up of Acute on Chronic Respiratory Failure.  Patient is on T collar she is actually weaning supposed to have a swallowing study done today  Medications: Reviewed on Rounds  Physical Exam:  Vitals: Temperature 98.4 pulse 78 respiratory 22 blood pressure 142/58 saturations 96%  Ventilator Settings currently on T collar with PMV  . General: Comfortable at this time . Eyes: Grossly normal lids, irises & conjunctiva . ENT: grossly tongue is normal . Neck: no obvious mass . Cardiovascular: S1 S2 normal no gallop . Respiratory: Coarse breath sounds no rhonchi are noted at this time . Abdomen: soft . Skin: no rash seen on limited exam . Musculoskeletal: not rigid . Psychiatric:unable to assess . Neurologic: no seizure no involuntary movements         Lab Data:   Basic Metabolic Panel: Recent Labs  Lab 05/10/18 1608 05/12/18 0530  NA  --  139  K  --  4.2  CL  --  101  CO2  --  29  GLUCOSE  --  117*  BUN  --  24*  CREATININE  --  <0.30*  CALCIUM  --  8.3*  MG 2.1  --     ABG: No results for input(s): PHART, PCO2ART, PO2ART, HCO3, O2SAT in the last 168 hours.  Liver Function Tests: Recent Labs  Lab 05/12/18 0530  AST 54*  ALT 71*  ALKPHOS 26*  BILITOT 1.2  PROT 4.8*  ALBUMIN 2.1*   No results for input(s): LIPASE, AMYLASE in the last 168 hours. No results for input(s): AMMONIA in the last 168 hours.  CBC: Recent Labs  Lab 05/12/18 0530  WBC 28.9*  HGB 8.5*  HCT 29.2*  MCV 95.7  PLT 790*    Cardiac Enzymes: No results for input(s): CKTOTAL, CKMB, CKMBINDEX, TROPONINI in the last 168 hours.  BNP (last 3 results) No results for input(s):  BNP in the last 8760 hours.  ProBNP (last 3 results) No results for input(s): PROBNP in the last 8760 hours.  Radiological Exams: No results found.  Assessment/Plan Active Problems:   Acute on chronic respiratory failure with hypoxia (HCC)   COPD, severe (HCC)   Rheumatoid arthritis (HCC)   Steroid-dependent chronic obstructive pulmonary disease (HCC)   Atrial fibrillation, chronic   1. Acute on chronic respiratory failure with hypoxia we will continue T collar titrate oxygen continue pulmonary toilet. 2. Severe COPD at baseline continue to follow 3. Rheumatoid arthritis treated 4. Steroid dependent titrate steroids down as tolerated 5. Chronic atrial fibrillation rate controlled   I have personally seen and evaluated the patient, evaluated laboratory and imaging results, formulated the assessment and plan and placed orders. The Patient requires high complexity decision making for assessment and support.  Case was discussed on Rounds with the Respiratory Therapy Staff  Yevonne Pax, MD [facility] Pulmonary Critical Care Medicine Sleep Medicine

## 2018-05-17 ENCOUNTER — Other Ambulatory Visit (HOSPITAL_COMMUNITY): Payer: Medicare HMO

## 2018-05-17 DIAGNOSIS — J9621 Acute and chronic respiratory failure with hypoxia: Secondary | ICD-10-CM | POA: Diagnosis not present

## 2018-05-17 DIAGNOSIS — I482 Chronic atrial fibrillation, unspecified: Secondary | ICD-10-CM | POA: Diagnosis not present

## 2018-05-17 DIAGNOSIS — J449 Chronic obstructive pulmonary disease, unspecified: Secondary | ICD-10-CM | POA: Diagnosis not present

## 2018-05-17 DIAGNOSIS — M069 Rheumatoid arthritis, unspecified: Secondary | ICD-10-CM | POA: Diagnosis not present

## 2018-05-17 NOTE — Progress Notes (Signed)
Pulmonary Critical Care Medicine [facility] [place]   PULMONARY CRITICAL CARE SERVICE  PROGRESS NOTE  Date of Service: 05/17/2018  Brittney Lowe  [id number]  DOB: 11-17-1950   DOA: 04/27/2018  Referring Physician: Carron Curie, MD  HPI: Brittney Lowe is a 68 y.o. female seen for follow up of Acute on Chronic Respiratory Failure.  Patient is on T collar at this time on 28% FiO2 good saturations are noted  Medications: Reviewed on Rounds  Physical Exam:  Vitals: Temperature 98.6 pulse 81 respiratory 26 blood pressure 141/50 saturations 97%  Ventilator Settings off the ventilator on T collar  . General: Comfortable at this time . Eyes: Grossly normal lids, irises & conjunctiva . ENT: grossly tongue is normal . Neck: no obvious mass . Cardiovascular: S1 S2 normal no gallop . Respiratory: No rhonchi or rales are noted at this time . Abdomen: soft . Skin: no rash seen on limited exam . Musculoskeletal: not rigid . Psychiatric:unable to assess . Neurologic: no seizure no involuntary movements         Lab Data:   Basic Metabolic Panel: Recent Labs  Lab 05/10/18 1608 05/12/18 0530  NA  --  139  K  --  4.2  CL  --  101  CO2  --  29  GLUCOSE  --  117*  BUN  --  24*  CREATININE  --  <0.30*  CALCIUM  --  8.3*  MG 2.1  --     ABG: No results for input(s): PHART, PCO2ART, PO2ART, HCO3, O2SAT in the last 168 hours.  Liver Function Tests: Recent Labs  Lab 05/12/18 0530  AST 54*  ALT 71*  ALKPHOS 26*  BILITOT 1.2  PROT 4.8*  ALBUMIN 2.1*   No results for input(s): LIPASE, AMYLASE in the last 168 hours. No results for input(s): AMMONIA in the last 168 hours.  CBC: Recent Labs  Lab 05/12/18 0530  WBC 28.9*  HGB 8.5*  HCT 29.2*  MCV 95.7  PLT 790*    Cardiac Enzymes: No results for input(s): CKTOTAL, CKMB, CKMBINDEX, TROPONINI in the last 168 hours.  BNP (last 3 results) No results for input(s): BNP in the last 8760  hours.  ProBNP (last 3 results) No results for input(s): PROBNP in the last 8760 hours.  Radiological Exams: No results found.  Assessment/Plan Active Problems:   Acute on chronic respiratory failure with hypoxia (HCC)   COPD, severe (HCC)   Rheumatoid arthritis (HCC)   Steroid-dependent chronic obstructive pulmonary disease (HCC)   Atrial fibrillation, chronic   1. Acute on chronic respiratory failure with hypoxia patient is weaning on T collar tolerating it well 2. Severe COPD continue pulmonary toilet supportive care 3. Rheumatoid arthritis stable at this time 4. Steroid dependent continue steroids 5. Chronic atrial fibrillation rate controlled   I have personally seen and evaluated the patient, evaluated laboratory and imaging results, formulated the assessment and plan and placed orders. The Patient requires high complexity decision making for assessment and support.  Case was discussed on Rounds with the Respiratory Therapy Staff  Yevonne Pax, MD [facility] Pulmonary Critical Care Medicine Sleep Medicine

## 2018-05-18 ENCOUNTER — Other Ambulatory Visit (HOSPITAL_COMMUNITY): Payer: Medicare HMO

## 2018-05-18 DIAGNOSIS — I482 Chronic atrial fibrillation, unspecified: Secondary | ICD-10-CM | POA: Diagnosis not present

## 2018-05-18 DIAGNOSIS — M069 Rheumatoid arthritis, unspecified: Secondary | ICD-10-CM | POA: Diagnosis not present

## 2018-05-18 DIAGNOSIS — J449 Chronic obstructive pulmonary disease, unspecified: Secondary | ICD-10-CM | POA: Diagnosis not present

## 2018-05-18 DIAGNOSIS — J9621 Acute and chronic respiratory failure with hypoxia: Secondary | ICD-10-CM | POA: Diagnosis not present

## 2018-05-18 LAB — BASIC METABOLIC PANEL
Anion gap: 13 (ref 5–15)
BUN: 21 mg/dL (ref 8–23)
CHLORIDE: 98 mmol/L (ref 98–111)
CO2: 28 mmol/L (ref 22–32)
Calcium: 8.6 mg/dL — ABNORMAL LOW (ref 8.9–10.3)
Glucose, Bld: 132 mg/dL — ABNORMAL HIGH (ref 70–99)
Potassium: 3.7 mmol/L (ref 3.5–5.1)
Sodium: 139 mmol/L (ref 135–145)

## 2018-05-18 LAB — CK TOTAL AND CKMB (NOT AT ARMC)
CK, MB: 1.4 ng/mL (ref 0.5–5.0)
Relative Index: INVALID (ref 0.0–2.5)
Total CK: 14 U/L — ABNORMAL LOW (ref 38–234)

## 2018-05-18 LAB — PHOSPHORUS: Phosphorus: 3.7 mg/dL (ref 2.5–4.6)

## 2018-05-18 LAB — TROPONIN I: Troponin I: <0.03 ng/mL (ref <0.03)

## 2018-05-18 LAB — MAGNESIUM: Magnesium: 1.9 mg/dL (ref 1.7–2.4)

## 2018-05-18 NOTE — Progress Notes (Addendum)
Pulmonary Critical Care Medicine [facility] [place]   PULMONARY CRITICAL CARE SERVICE  PROGRESS NOTE  Date of Service: 05/18/2018  Brittney Lowe  [id number]  DOB: 07-24-1950   DOA: 04/27/2018  Referring Physician: Carron Curie, MD  HPI: Brittney Lowe is a 68 y.o. female seen for follow up of Acute on Chronic Respiratory Failure.  Patient remains on trach collar at this time.  Apparently during feeding the patient possibly aspirated and required more FiO2 afterward.  Her FiO2 was increased to 40% and now has been reduced back to 35%.  She appears stable at this time.  Medications: Reviewed on Rounds  Physical Exam:  Vitals: Pulse 80 respirations 24 BP 110/47 O2 sat 92% temp 94.6  Ventilator Settings patient not currently on ventilator  . General: Comfortable at this time . Eyes: Grossly normal lids, irises & conjunctiva . ENT: grossly tongue is normal . Neck: no obvious mass . Cardiovascular: S1 S2 normal no gallop . Respiratory: Coarse breath sounds . Abdomen: soft . Skin: no rash seen on limited exam . Musculoskeletal: not rigid . Psychiatric:unable to assess . Neurologic: no seizure no involuntary movements         Lab Data:   Basic Metabolic Panel: Recent Labs  Lab 05/12/18 0530  NA 139  K 4.2  CL 101  CO2 29  GLUCOSE 117*  BUN 24*  CREATININE <0.30*  CALCIUM 8.3*    ABG: No results for input(s): PHART, PCO2ART, PO2ART, HCO3, O2SAT in the last 168 hours.  Liver Function Tests: Recent Labs  Lab 05/12/18 0530  AST 54*  ALT 71*  ALKPHOS 26*  BILITOT 1.2  PROT 4.8*  ALBUMIN 2.1*   No results for input(s): LIPASE, AMYLASE in the last 168 hours. No results for input(s): AMMONIA in the last 168 hours.  CBC: Recent Labs  Lab 05/12/18 0530  WBC 28.9*  HGB 8.5*  HCT 29.2*  MCV 95.7  PLT 790*    Cardiac Enzymes: No results for input(s): CKTOTAL, CKMB, CKMBINDEX, TROPONINI in the last 168 hours.  BNP (last 3  results) No results for input(s): BNP in the last 8760 hours.  ProBNP (last 3 results) No results for input(s): PROBNP in the last 8760 hours.  Radiological Exams: Dg Chest Port 1 View  Result Date: 05/18/2018 CLINICAL DATA:  Aspiration. EXAM: PORTABLE CHEST 1 VIEW COMPARISON:  Chest x-ray dated May 12, 2018. FINDINGS: The patient is rotated to the left. Unchanged tracheostomy tube with tip at the thoracic inlet. The heart size and mediastinal contours are within normal limits. Normal pulmonary vascularity. Atherosclerotic calcification of the aortic arch. The lungs remain hyperinflated with emphysematous changes. No focal consolidation, pleural effusion, or pneumothorax. No acute osseous abnormality. IMPRESSION: 1. No active disease. 2. COPD. Electronically Signed   By: Obie Dredge M.D.   On: 05/18/2018 08:49   Dg Abd Portable 1v  Result Date: 05/17/2018 CLINICAL DATA:  Abdominal pain. EXAM: PORTABLE ABDOMEN - 1 VIEW COMPARISON:  Radiograph of April 27, 2018. FINDINGS: Gastrostomy tube appears to be well positioned within gastric lumen. Residual contrast is noted throughout the colon. No abnormal bowel dilatation is noted. No abnormal calcifications are noted. IMPRESSION: Gastrostomy tube in grossly good position. No evidence of bowel obstruction or ileus. Electronically Signed   By: Lupita Raider, M.D.   On: 05/17/2018 13:04    Assessment/Plan Active Problems:   Acute on chronic respiratory failure with hypoxia (HCC)   COPD, severe (HCC)   Rheumatoid arthritis (HCC)  Steroid-dependent chronic obstructive pulmonary disease (HCC)   Atrial fibrillation, chronic   1. Acute on chronic respiratory failure with hypoxia patient shall continue to wean on trach collar as tolerated per protocol 2. Severe COPD continue pulmonary toilet supportive care 3. Rheumatoid arthritis stable at this time 4. Steroid dependent continue steroids 5. Chronic atrial fibrillation rate controlled   I  have personally seen and evaluated the patient, evaluated laboratory and imaging results, formulated the assessment and plan and placed orders. The Patient requires high complexity decision making for assessment and support.  Case was discussed on Rounds with the Respiratory Therapy Staff  Yevonne Pax, MD [facility] Pulmonary Critical Care Medicine Sleep Medicine

## 2018-05-19 DIAGNOSIS — M069 Rheumatoid arthritis, unspecified: Secondary | ICD-10-CM | POA: Diagnosis not present

## 2018-05-19 DIAGNOSIS — I482 Chronic atrial fibrillation, unspecified: Secondary | ICD-10-CM | POA: Diagnosis not present

## 2018-05-19 DIAGNOSIS — J9621 Acute and chronic respiratory failure with hypoxia: Secondary | ICD-10-CM | POA: Diagnosis not present

## 2018-05-19 DIAGNOSIS — J449 Chronic obstructive pulmonary disease, unspecified: Secondary | ICD-10-CM | POA: Diagnosis not present

## 2018-05-19 NOTE — Progress Notes (Addendum)
Pulmonary Critical Care Medicine [facility] [place]   PULMONARY CRITICAL CARE SERVICE  PROGRESS NOTE  Date of Service: 05/19/2018  Brittney Lowe  [id number]  DOB: 08/19/1950   DOA: 04/27/2018  Referring Physician: Carron Curie, MD  HPI: Brittney Lowe is a 68 y.o. female seen for follow up of Acute on Chronic Respiratory Failure.  Patient remains on trach collar 28% FiO2 at this time.  Currently doing well at this time FiO2 has returned to 28% from 40%.  Minimal secretions noted.  She was only able to tolerate PMV for about 30 minutes before she grew tired and had to have it removed.  Medications: Reviewed on Rounds  Physical Exam:  Vitals: Pulse 75 respirations 21 BP 121/62 O2 sat 100% temp 98.4  Ventilator Settings patient's not currently on ventilator  . General: Comfortable at this time . Eyes: Grossly normal lids, irises & conjunctiva . ENT: grossly tongue is normal . Neck: no obvious mass . Cardiovascular: S1 S2 normal no gallop . Respiratory: Coarse breath sounds . Abdomen: soft . Skin: no rash seen on limited exam . Musculoskeletal: not rigid . Psychiatric:unable to assess . Neurologic: no seizure no involuntary movements         Lab Data:   Basic Metabolic Panel: Recent Labs  Lab 05/18/18 2241  NA 139  K 3.7  CL 98  CO2 28  GLUCOSE 132*  BUN 21  CREATININE <0.30*  CALCIUM 8.6*  MG 1.9  PHOS 3.7    ABG: No results for input(s): PHART, PCO2ART, PO2ART, HCO3, O2SAT in the last 168 hours.  Liver Function Tests: No results for input(s): AST, ALT, ALKPHOS, BILITOT, PROT, ALBUMIN in the last 168 hours. No results for input(s): LIPASE, AMYLASE in the last 168 hours. No results for input(s): AMMONIA in the last 168 hours.  CBC: No results for input(s): WBC, NEUTROABS, HGB, HCT, MCV, PLT in the last 168 hours.  Cardiac Enzymes: Recent Labs  Lab 05/18/18 2241  CKTOTAL 14*  CKMB 1.4  TROPONINI <0.03    BNP (last 3  results) No results for input(s): BNP in the last 8760 hours.  ProBNP (last 3 results) No results for input(s): PROBNP in the last 8760 hours.  Radiological Exams: Dg Chest Port 1 View  Result Date: 05/18/2018 CLINICAL DATA:  Aspiration. EXAM: PORTABLE CHEST 1 VIEW COMPARISON:  Chest x-ray dated May 12, 2018. FINDINGS: The patient is rotated to the left. Unchanged tracheostomy tube with tip at the thoracic inlet. The heart size and mediastinal contours are within normal limits. Normal pulmonary vascularity. Atherosclerotic calcification of the aortic arch. The lungs remain hyperinflated with emphysematous changes. No focal consolidation, pleural effusion, or pneumothorax. No acute osseous abnormality. IMPRESSION: 1. No active disease. 2. COPD. Electronically Signed   By: Obie Dredge M.D.   On: 05/18/2018 08:49    Assessment/Plan Active Problems:   Acute on chronic respiratory failure with hypoxia (HCC)   COPD, severe (HCC)   Rheumatoid arthritis (HCC)   Steroid-dependent chronic obstructive pulmonary disease (HCC)   Atrial fibrillation, chronic   1. Acute on chronic respiratory failure with hypoxia continue to wean trach collar per protocol.  Watch for signs of aspiration pneumonia. 2. Severe COPD continue pulmonary toilet and supportive care. 3. Rheumatoid arthritis stable this time 4. Steroid dependent continue steroids 5. Chronic atrial fibrillation rate controlled   I have personally seen and evaluated the patient, evaluated laboratory and imaging results, formulated the assessment and plan and placed orders. The Patient  requires high complexity decision making for assessment and support.  Case was discussed on Rounds with the Respiratory Therapy Staff  Yevonne Pax, MD [facility] Pulmonary Critical Care Medicine Sleep Medicine

## 2018-05-20 DIAGNOSIS — J9621 Acute and chronic respiratory failure with hypoxia: Secondary | ICD-10-CM | POA: Diagnosis not present

## 2018-05-20 DIAGNOSIS — J449 Chronic obstructive pulmonary disease, unspecified: Secondary | ICD-10-CM | POA: Diagnosis not present

## 2018-05-20 DIAGNOSIS — I482 Chronic atrial fibrillation, unspecified: Secondary | ICD-10-CM | POA: Diagnosis not present

## 2018-05-20 DIAGNOSIS — M069 Rheumatoid arthritis, unspecified: Secondary | ICD-10-CM | POA: Diagnosis not present

## 2018-05-20 LAB — CBC
HCT: 26.4 % — ABNORMAL LOW (ref 36.0–46.0)
Hemoglobin: 7.4 g/dL — ABNORMAL LOW (ref 12.0–15.0)
MCH: 26.2 pg (ref 26.0–34.0)
MCHC: 28 g/dL — ABNORMAL LOW (ref 30.0–36.0)
MCV: 93.6 fL (ref 80.0–100.0)
PLATELETS: 503 10*3/uL — AB (ref 150–400)
RBC: 2.82 MIL/uL — AB (ref 3.87–5.11)
RDW: 16.2 % — ABNORMAL HIGH (ref 11.5–15.5)
WBC: 14.4 10*3/uL — ABNORMAL HIGH (ref 4.0–10.5)
nRBC: 0 % (ref 0.0–0.2)

## 2018-05-20 NOTE — Progress Notes (Signed)
Pulmonary Critical Care Medicine [facility] [place]   PULMONARY CRITICAL CARE SERVICE  PROGRESS NOTE  Date of Service: 05/20/2018  Brittney Lowe  [id number]  DOB: [birth year]   DOA: 04/27/2018  Referring Physician: Carron Curie, MD  HPI: Brittney Lowe is a 68 y.o. female seen for follow up of Acute on Chronic Respiratory Failure.  Patient's husband was in the room he was updated.  She is doing fine as far as being able to wean she is on T collar has been on 28% FiO2 also has been tolerating the PMV  Medications: Reviewed on Rounds  Physical Exam:  Vitals: Temperature 97.4 pulse 71 respiratory 18 blood pressure 165/76 saturations 96%  Ventilator Settings currently off the ventilator on T collar  . General: Comfortable at this time . Eyes: Grossly normal lids, irises & conjunctiva . ENT: grossly tongue is normal . Neck: no obvious mass . Cardiovascular: S1 S2 normal no gallop . Respiratory: Scattered rhonchi expansion is equal . Abdomen: soft . Skin: no rash seen on limited exam . Musculoskeletal: not rigid . Psychiatric:unable to assess . Neurologic: no seizure no involuntary movements         Lab Data:   Basic Metabolic Panel: Recent Labs  Lab 05/18/18 2241  NA 139  K 3.7  CL 98  CO2 28  GLUCOSE 132*  BUN 21  CREATININE <0.30*  CALCIUM 8.6*  MG 1.9  PHOS 3.7    ABG: No results for input(s): PHART, PCO2ART, PO2ART, HCO3, O2SAT in the last 168 hours.  Liver Function Tests: No results for input(s): AST, ALT, ALKPHOS, BILITOT, PROT, ALBUMIN in the last 168 hours. No results for input(s): LIPASE, AMYLASE in the last 168 hours. No results for input(s): AMMONIA in the last 168 hours.  CBC: Recent Labs  Lab 05/20/18 0705  WBC 14.4*  HGB 7.4*  HCT 26.4*  MCV 93.6  PLT 503*    Cardiac Enzymes: Recent Labs  Lab 05/18/18 2241  CKTOTAL 14*  CKMB 1.4  TROPONINI <0.03    BNP (last 3 results) No results for input(s): BNP in the  last 8760 hours.  ProBNP (last 3 results) No results for input(s): PROBNP in the last 8760 hours.  Radiological Exams: No results found.  Assessment/Plan Active Problems:   Acute on chronic respiratory failure with hypoxia (HCC)   COPD, severe (HCC)   Rheumatoid arthritis (HCC)   Steroid-dependent chronic obstructive pulmonary disease (HCC)   Atrial fibrillation, chronic   1. Acute on chronic respiratory failure with hypoxia we will continue with T collar trials continue pulmonary toilet supportive care also continue with the PMV 2. Severe COPD at baseline continue present therapy 3. Rheumatoid arthritis treated we will monitor 4. Steroid dependent tapering steroids slowly 5. Chronic atrial fibrillation rate controlled at this time   I have personally seen and evaluated the patient, evaluated laboratory and imaging results, formulated the assessment and plan and placed orders. The Patient requires high complexity decision making for assessment and support.  Case was discussed on Rounds with the Respiratory Therapy Staff  Yevonne Pax, MD [facility] Pulmonary Critical Care Medicine Sleep Medicine

## 2018-05-21 ENCOUNTER — Other Ambulatory Visit (HOSPITAL_COMMUNITY): Payer: Medicare HMO

## 2018-05-21 DIAGNOSIS — J449 Chronic obstructive pulmonary disease, unspecified: Secondary | ICD-10-CM | POA: Diagnosis not present

## 2018-05-21 DIAGNOSIS — I482 Chronic atrial fibrillation, unspecified: Secondary | ICD-10-CM | POA: Diagnosis not present

## 2018-05-21 DIAGNOSIS — M069 Rheumatoid arthritis, unspecified: Secondary | ICD-10-CM | POA: Diagnosis not present

## 2018-05-21 DIAGNOSIS — J9621 Acute and chronic respiratory failure with hypoxia: Secondary | ICD-10-CM | POA: Diagnosis not present

## 2018-05-21 LAB — BASIC METABOLIC PANEL
Anion gap: 8 (ref 5–15)
BUN: 16 mg/dL (ref 8–23)
CO2: 32 mmol/L (ref 22–32)
Calcium: 9 mg/dL (ref 8.9–10.3)
Chloride: 101 mmol/L (ref 98–111)
Creatinine, Ser: 0.37 mg/dL — ABNORMAL LOW (ref 0.44–1.00)
GFR calc Af Amer: >60 mL/min (ref >60)
GFR calc non Af Amer: >60 mL/min (ref >60)
Glucose, Bld: 190 mg/dL — ABNORMAL HIGH (ref 70–99)
Potassium: 3.8 mmol/L (ref 3.5–5.1)
Sodium: 141 mmol/L (ref 135–145)

## 2018-05-21 LAB — MAGNESIUM: Magnesium: 1.8 mg/dL (ref 1.7–2.4)

## 2018-05-21 NOTE — Progress Notes (Signed)
Pulmonary Critical Care Medicine [facility] [place]   PULMONARY CRITICAL CARE SERVICE  PROGRESS NOTE  Date of Service: 05/21/2018  Brittney Lowe  QQU:[id number]  DOB: [birth year]   DOA: 04/27/2018  Referring Physician: Carron Curie, MD  HPI: Brittney Lowe is a 68 y.o. female seen for follow up of Acute on Chronic Respiratory Failure.  Patient is currently on T collar weaning.  She has been having some cardiac arrhythmias right now appear to be in atrial flutter  Medications: Reviewed on Rounds  Physical Exam:  Vitals: Temperature 97.2 pulse 81 respiratory 20 blood pressure 115/76 saturation 96%  Ventilator Settings off the ventilator on T collar at this time  . General: Comfortable at this time . Eyes: Grossly normal lids, irises & conjunctiva . ENT: grossly tongue is normal . Neck: no obvious mass . Cardiovascular: S1 S2 normal no gallop . Respiratory: No rhonchi or rales coarse breath sounds . Abdomen: soft . Skin: no rash seen on limited exam . Musculoskeletal: not rigid . Psychiatric:unable to assess . Neurologic: no seizure no involuntary movements         Lab Data:   Basic Metabolic Panel: Recent Labs  Lab 05/18/18 2241  NA 139  K 3.7  CL 98  CO2 28  GLUCOSE 132*  BUN 21  CREATININE <0.30*  CALCIUM 8.6*  MG 1.9  PHOS 3.7    ABG: No results for input(s): PHART, PCO2ART, PO2ART, HCO3, O2SAT in the last 168 hours.  Liver Function Tests: No results for input(s): AST, ALT, ALKPHOS, BILITOT, PROT, ALBUMIN in the last 168 hours. No results for input(s): LIPASE, AMYLASE in the last 168 hours. No results for input(s): AMMONIA in the last 168 hours.  CBC: Recent Labs  Lab 05/20/18 0705  WBC 14.4*  HGB 7.4*  HCT 26.4*  MCV 93.6  PLT 503*    Cardiac Enzymes: Recent Labs  Lab 05/18/18 2241  CKTOTAL 14*  CKMB 1.4  TROPONINI <0.03    BNP (last 3 results) No results for input(s): BNP in the last 8760 hours.  ProBNP (last 3  results) No results for input(s): PROBNP in the last 8760 hours.  Radiological Exams: No results found.  Assessment/Plan Active Problems:   Acute on chronic respiratory failure with hypoxia (HCC)   COPD, severe (HCC)   Rheumatoid arthritis (HCC)   Steroid-dependent chronic obstructive pulmonary disease (HCC)   Atrial fibrillation, chronic   1. Acute on chronic respiratory failure with hypoxia we will continue with T collar trials continue pulmonary toilet secretion management. 2. Severe COPD at baseline steroid therapy 3. Steroid dependence as above 4. Rheumatoid arthritis treated 5. Chronic atrial fibrillation flutter rate now is in flutter with a controlled rate   I have personally seen and evaluated the patient, evaluated laboratory and imaging results, formulated the assessment and plan and placed orders. The Patient requires high complexity decision making for assessment and support.  Case was discussed on Rounds with the Respiratory Therapy Staff  Yevonne Pax, MD [facility] Pulmonary Critical Care Medicine Sleep Medicine

## 2018-05-22 DIAGNOSIS — J449 Chronic obstructive pulmonary disease, unspecified: Secondary | ICD-10-CM | POA: Diagnosis not present

## 2018-05-22 DIAGNOSIS — I482 Chronic atrial fibrillation, unspecified: Secondary | ICD-10-CM | POA: Diagnosis not present

## 2018-05-22 DIAGNOSIS — J9621 Acute and chronic respiratory failure with hypoxia: Secondary | ICD-10-CM | POA: Diagnosis not present

## 2018-05-22 DIAGNOSIS — M069 Rheumatoid arthritis, unspecified: Secondary | ICD-10-CM | POA: Diagnosis not present

## 2018-05-22 NOTE — Progress Notes (Signed)
Pulmonary Critical Care Medicine [facility] [place]   PULMONARY CRITICAL CARE SERVICE  PROGRESS NOTE  Date of Service: 05/22/2018  Brittney Lowe  QQI:[id number]  DOB: 01/08/1951   DOA: 04/27/2018  Referring Physician: Carron Curie, MD  HPI: Brittney Lowe is a 68 y.o. female seen for follow up of Acute on Chronic Respiratory Failure.  Continues on T collar patient's been on 28% FiO2 has been using the PMV.  Occasionally will have some mild desaturations noted as observed by respiratory therapy  Medications: Reviewed on Rounds  Physical Exam:  Vitals: Temperature 98.1 pulse 99 respiratory rate 12 blood pressure is 118/42 saturations 95%  Ventilator Settings off the ventilator on T collar currently  . General: Comfortable at this time . Eyes: Grossly normal lids, irises & conjunctiva . ENT: grossly tongue is normal . Neck: no obvious mass . Cardiovascular: S1 S2 normal no gallop . Respiratory: Scattered rhonchi expansion is equal . Abdomen: soft . Skin: no rash seen on limited exam . Musculoskeletal: not rigid . Psychiatric:unable to assess . Neurologic: no seizure no involuntary movements         Lab Data:   Basic Metabolic Panel: Recent Labs  Lab 05/18/18 2241 05/21/18 1536  NA 139 141  K 3.7 3.8  CL 98 101  CO2 28 32  GLUCOSE 132* 190*  BUN 21 16  CREATININE <0.30* 0.37*  CALCIUM 8.6* 9.0  MG 1.9 1.8  PHOS 3.7  --     ABG: No results for input(s): PHART, PCO2ART, PO2ART, HCO3, O2SAT in the last 168 hours.  Liver Function Tests: No results for input(s): AST, ALT, ALKPHOS, BILITOT, PROT, ALBUMIN in the last 168 hours. No results for input(s): LIPASE, AMYLASE in the last 168 hours. No results for input(s): AMMONIA in the last 168 hours.  CBC: Recent Labs  Lab 05/20/18 0705  WBC 14.4*  HGB 7.4*  HCT 26.4*  MCV 93.6  PLT 503*    Cardiac Enzymes: Recent Labs  Lab 05/18/18 2241  CKTOTAL 14*  CKMB 1.4  TROPONINI <0.03     BNP (last 3 results) No results for input(s): BNP in the last 8760 hours.  ProBNP (last 3 results) No results for input(s): PROBNP in the last 8760 hours.  Radiological Exams: Dg Chest Port 1 View  Result Date: 05/21/2018 CLINICAL DATA:  Pneumonia. EXAM: PORTABLE CHEST 1 VIEW COMPARISON:  One-view chest x-ray 05/18/2018 FINDINGS: Tracheostomy tube is stable. Heart is mildly enlarged. There is no edema or effusion. No focal airspace disease is present. Changes of COPD are again noted. Aortic atherosclerosis is present. IMPRESSION: 1. No acute cardiopulmonary disease or significant interval change. 2. COPD. Electronically Signed   By: Marin Roberts M.D.   On: 05/21/2018 15:37    Assessment/Plan Active Problems:   Acute on chronic respiratory failure with hypoxia (HCC)   COPD, severe (HCC)   Rheumatoid arthritis (HCC)   Steroid-dependent chronic obstructive pulmonary disease (HCC)   Atrial fibrillation, chronic   1. Acute on chronic respiratory failure with hypoxia we will continue with T collar trials as ordered continue secretion management pulmonary toilet 2. Severe COPD at baseline continue with present therapy 3. Steroid dependence weaning steroids should go slowly 4. Chronic atrial fibrillation rate controlled 5. Rheumatoid arthritis at baseline   I have personally seen and evaluated the patient, evaluated laboratory and imaging results, formulated the assessment and plan and placed orders. The Patient requires high complexity decision making for assessment and support.  Case was discussed  on Rounds with the Respiratory Therapy Staff  Allyne Gee, MD [facility] Pulmonary Critical Care Medicine Sleep Medicine

## 2018-05-23 DIAGNOSIS — I482 Chronic atrial fibrillation, unspecified: Secondary | ICD-10-CM | POA: Diagnosis not present

## 2018-05-23 DIAGNOSIS — J449 Chronic obstructive pulmonary disease, unspecified: Secondary | ICD-10-CM | POA: Diagnosis not present

## 2018-05-23 DIAGNOSIS — M069 Rheumatoid arthritis, unspecified: Secondary | ICD-10-CM | POA: Diagnosis not present

## 2018-05-23 DIAGNOSIS — J9621 Acute and chronic respiratory failure with hypoxia: Secondary | ICD-10-CM | POA: Diagnosis not present

## 2018-05-23 NOTE — Progress Notes (Signed)
Pulmonary Critical Care Medicine [facility] [place]   PULMONARY CRITICAL CARE SERVICE  PROGRESS NOTE  Date of Service: 05/23/2018  Lorrin GoodellDeborah A [place]burg  NGE:[id number]  DOB: August 11, 1950   DOA: 04/27/2018  Referring Physician: Carron CurieAli Hijazi, MD  HPI: Lorrin GoodellDeborah A Strausser is a 68 y.o. female seen for follow up of Acute on Chronic Respiratory Failure.  Patient at this time is on T collar has been on 40% FiO2 good saturations are noted  Medications: Reviewed on Rounds  Physical Exam:  Vitals: Temperature 96.9 pulse 78 respiratory 25 blood pressure 126/48 saturations 95%  Ventilator Settings off the ventilator on T collar  . General: Comfortable at this time . Eyes: Grossly normal lids, irises & conjunctiva . ENT: grossly tongue is normal . Neck: no obvious mass . Cardiovascular: S1 S2 normal no gallop . Respiratory: No rhonchi or rales are noted . Abdomen: soft . Skin: no rash seen on limited exam . Musculoskeletal: not rigid . Psychiatric:unable to assess . Neurologic: no seizure no involuntary movements         Lab Data:   Basic Metabolic Panel: Recent Labs  Lab 05/18/18 2241 05/21/18 1536  NA 139 141  K 3.7 3.8  CL 98 101  CO2 28 32  GLUCOSE 132* 190*  BUN 21 16  CREATININE <0.30* 0.37*  CALCIUM 8.6* 9.0  MG 1.9 1.8  PHOS 3.7  --     ABG: No results for input(s): PHART, PCO2ART, PO2ART, HCO3, O2SAT in the last 168 hours.  Liver Function Tests: No results for input(s): AST, ALT, ALKPHOS, BILITOT, PROT, ALBUMIN in the last 168 hours. No results for input(s): LIPASE, AMYLASE in the last 168 hours. No results for input(s): AMMONIA in the last 168 hours.  CBC: Recent Labs  Lab 05/20/18 0705  WBC 14.4*  HGB 7.4*  HCT 26.4*  MCV 93.6  PLT 503*    Cardiac Enzymes: Recent Labs  Lab 05/18/18 2241  CKTOTAL 14*  CKMB 1.4  TROPONINI <0.03    BNP (last 3 results) No results for input(s): BNP in the last 8760 hours.  ProBNP (last 3  results) No results for input(s): PROBNP in the last 8760 hours.  Radiological Exams: Dg Chest Port 1 View  Result Date: 05/21/2018 CLINICAL DATA:  Pneumonia. EXAM: PORTABLE CHEST 1 VIEW COMPARISON:  One-view chest x-ray 05/18/2018 FINDINGS: Tracheostomy tube is stable. Heart is mildly enlarged. There is no edema or effusion. No focal airspace disease is present. Changes of COPD are again noted. Aortic atherosclerosis is present. IMPRESSION: 1. No acute cardiopulmonary disease or significant interval change. 2. COPD. Electronically Signed   By: Marin Robertshristopher  Mattern M.D.   On: 05/21/2018 15:37    Assessment/Plan Active Problems:   Acute on chronic respiratory failure with hypoxia (HCC)   COPD, severe (HCC)   Rheumatoid arthritis (HCC)   Steroid-dependent chronic obstructive pulmonary disease (HCC)   Atrial fibrillation, chronic   1. Acute on chronic respiratory failure with hypoxia we will continue to wean on T collar she is been having some difficulty tolerating the PMV respiratory we will continue to reassess. 2. Severe COPD advanced disease steroid dependent 3. Rheumatoid arthritis stable 4. Steroid dependence Taper down 5. Chronic atrial fibrillation rate controlled   I have personally seen and evaluated the patient, evaluated laboratory and imaging results, formulated the assessment and plan and placed orders. The Patient requires high complexity decision making for assessment and support.  Case was discussed on Rounds with the Respiratory Therapy Staff  [facility] Hospitalaadat  Richardson Dopp, MD [facility] Pulmonary Critical Care Medicine Sleep Medicine

## 2018-05-24 ENCOUNTER — Other Ambulatory Visit (HOSPITAL_COMMUNITY): Payer: Medicare HMO

## 2018-05-24 DIAGNOSIS — J9621 Acute and chronic respiratory failure with hypoxia: Secondary | ICD-10-CM | POA: Diagnosis not present

## 2018-05-24 DIAGNOSIS — M069 Rheumatoid arthritis, unspecified: Secondary | ICD-10-CM | POA: Diagnosis not present

## 2018-05-24 DIAGNOSIS — I482 Chronic atrial fibrillation, unspecified: Secondary | ICD-10-CM | POA: Diagnosis not present

## 2018-05-24 DIAGNOSIS — J449 Chronic obstructive pulmonary disease, unspecified: Secondary | ICD-10-CM | POA: Diagnosis not present

## 2018-05-24 NOTE — Progress Notes (Signed)
Pulmonary Critical Care Medicine [facility] [place]   PULMONARY CRITICAL CARE SERVICE  PROGRESS NOTE  Date of Service: 05/24/2018  Brittney Lowe  [id number]  DOB: 03/27/1951   DOA: 04/27/2018  Referring Physician: Carron Curie, MD  HPI: Brittney Lowe is a 68 y.o. female seen for follow up of Acute on Chronic Respiratory Failure.  Patient remains on T collar she is comfortable on current settings.  Remains on 35% FiO2  Medications: Reviewed on Rounds  Physical Exam:  Vitals: Temperature 97.5 pulse 75 respiratory 23 blood pressure 141/63 saturations 100%  Ventilator Settings off the ventilator on T collar FiO2 35%  . General: Comfortable at this time . Eyes: Grossly normal lids, irises & conjunctiva . ENT: grossly tongue is normal . Neck: no obvious mass . Cardiovascular: S1 S2 normal no gallop . Respiratory: Scattered rhonchi expansion is equal . Abdomen: soft . Skin: no rash seen on limited exam . Musculoskeletal: not rigid . Psychiatric:unable to assess . Neurologic: no seizure no involuntary movements         Lab Data:   Basic Metabolic Panel: Recent Labs  Lab 05/18/18 2241 05/21/18 1536  NA 139 141  K 3.7 3.8  CL 98 101  CO2 28 32  GLUCOSE 132* 190*  BUN 21 16  CREATININE <0.30* 0.37*  CALCIUM 8.6* 9.0  MG 1.9 1.8  PHOS 3.7  --     ABG: No results for input(s): PHART, PCO2ART, PO2ART, HCO3, O2SAT in the last 168 hours.  Liver Function Tests: No results for input(s): AST, ALT, ALKPHOS, BILITOT, PROT, ALBUMIN in the last 168 hours. No results for input(s): LIPASE, AMYLASE in the last 168 hours. No results for input(s): AMMONIA in the last 168 hours.  CBC: Recent Labs  Lab 05/20/18 0705  WBC 14.4*  HGB 7.4*  HCT 26.4*  MCV 93.6  PLT 503*    Cardiac Enzymes: Recent Labs  Lab 05/18/18 2241  CKTOTAL 14*  CKMB 1.4  TROPONINI <0.03    BNP (last 3 results) No results for input(s): BNP in the last 8760  hours.  ProBNP (last 3 results) No results for input(s): PROBNP in the last 8760 hours.  Radiological Exams: Dg Chest Port 1 View  Result Date: 05/24/2018 CLINICAL DATA:  Respiratory failure EXAM: PORTABLE CHEST 1 VIEW COMPARISON:  05/21/2018 FINDINGS: Tracheostomy tube is noted in place. Cardiac shadow is within normal limits. Aortic calcifications are noted. Lungs are hyperinflated consistent with COPD. No focal infiltrate or sizable effusion is noted. No bony abnormality is seen. IMPRESSION: COPD without acute abnormality. Electronically Signed   By: Alcide Clever M.D.   On: 05/24/2018 08:20    Assessment/Plan Active Problems:   Acute on chronic respiratory failure with hypoxia (HCC)   COPD, severe (HCC)   Rheumatoid arthritis (HCC)   Steroid-dependent chronic obstructive pulmonary disease (HCC)   Atrial fibrillation, chronic   1. Acute on chronic respiratory failure with hypoxia we will continue with T collar trials titrate oxygen continue pulmonary toilet 2. Severe COPD at baseline we will continue present management 3. Rheumatoid arthritis stable 4. Steroid dependence weaning prednisone 5. Chronic atrial fibrillation rate controlled   I have personally seen and evaluated the patient, evaluated laboratory and imaging results, formulated the assessment and plan and placed orders. The Patient requires high complexity decision making for assessment and support.  Case was discussed on Rounds with the Respiratory Therapy Staff  Yevonne Pax, MD [facility] Pulmonary Critical Care Medicine Sleep Medicine

## 2018-05-25 ENCOUNTER — Other Ambulatory Visit (HOSPITAL_COMMUNITY): Payer: Medicare HMO

## 2018-05-25 DIAGNOSIS — I482 Chronic atrial fibrillation, unspecified: Secondary | ICD-10-CM | POA: Diagnosis not present

## 2018-05-25 DIAGNOSIS — M069 Rheumatoid arthritis, unspecified: Secondary | ICD-10-CM | POA: Diagnosis not present

## 2018-05-25 DIAGNOSIS — J449 Chronic obstructive pulmonary disease, unspecified: Secondary | ICD-10-CM | POA: Diagnosis not present

## 2018-05-25 DIAGNOSIS — J9621 Acute and chronic respiratory failure with hypoxia: Secondary | ICD-10-CM | POA: Diagnosis not present

## 2018-05-25 LAB — PROTIME-INR
INR: 1.36
Prothrombin Time: 16.6 seconds — ABNORMAL HIGH (ref 11.4–15.2)

## 2018-05-25 NOTE — Progress Notes (Addendum)
Pulmonary Critical Care Medicine [facility] [place]   PULMONARY CRITICAL CARE SERVICE  PROGRESS NOTE  Date of Service: 05/25/2018  Brittney Lowe  GXQ:[id number]  DOB: Feb 25, 1951   DOA: 04/27/2018  Referring Physician: Carron Curie, MD  HPI: Brittney Lowe is a 68 y.o. female seen for follow up of Acute on Chronic Respiratory Failure.  Patient continues to do well on trach collar at this time.  FiO2 35%.  Minimal secretions noted.  Medications: Reviewed on Rounds  Physical Exam:  Vitals: Pulse 78 respirations 20 BP 1 3475 O2 sat 95% temp 97.2  Ventilator Settings patient's not currently on ventilator  . General: Comfortable at this time . Eyes: Grossly normal lids, irises & conjunctiva . ENT: grossly tongue is normal . Neck: no obvious mass . Cardiovascular: S1 S2 normal no gallop . Respiratory: Coarse with sounds . Abdomen: soft . Skin: no rash seen on limited exam . Musculoskeletal: not rigid . Psychiatric:unable to assess . Neurologic: no seizure no involuntary movements         Lab Data:   Basic Metabolic Panel: Recent Labs  Lab 05/21/18 1536  NA 141  K 3.8  CL 101  CO2 32  GLUCOSE 190*  BUN 16  CREATININE 0.37*  CALCIUM 9.0  MG 1.8    ABG: No results for input(s): PHART, PCO2ART, PO2ART, HCO3, O2SAT in the last 168 hours.  Liver Function Tests: No results for input(s): AST, ALT, ALKPHOS, BILITOT, PROT, ALBUMIN in the last 168 hours. No results for input(s): LIPASE, AMYLASE in the last 168 hours. No results for input(s): AMMONIA in the last 168 hours.  CBC: Recent Labs  Lab 05/20/18 0705  WBC 14.4*  HGB 7.4*  HCT 26.4*  MCV 93.6  PLT 503*    Cardiac Enzymes: No results for input(s): CKTOTAL, CKMB, CKMBINDEX, TROPONINI in the last 168 hours.  BNP (last 3 results) No results for input(s): BNP in the last 8760 hours.  ProBNP (last 3 results) No results for input(s): PROBNP in the last 8760 hours.  Radiological  Exams: Dg Chest Port 1 View  Result Date: 05/24/2018 CLINICAL DATA:  Respiratory failure EXAM: PORTABLE CHEST 1 VIEW COMPARISON:  05/21/2018 FINDINGS: Tracheostomy tube is noted in place. Cardiac shadow is within normal limits. Aortic calcifications are noted. Lungs are hyperinflated consistent with COPD. No focal infiltrate or sizable effusion is noted. No bony abnormality is seen. IMPRESSION: COPD without acute abnormality. Electronically Signed   By: Alcide Clever M.D.   On: 05/24/2018 08:20   Dg Abd Portable 1v  Result Date: 05/25/2018 CLINICAL DATA:  68 year old female with history of left-sided abdominal pain for the past 3 days. EXAM: PORTABLE ABDOMEN - 1 VIEW COMPARISON:  05/17/2018. FINDINGS: Percutaneous gastrostomy tube projecting over the left upper quadrant of the abdomen. Gas and stool noted throughout the colon extending to the level of the distal rectum. No pathologic dilatation of small bowel. No pneumoperitoneum. IMPRESSION: 1. Nonobstructive bowel gas pattern. 2. No pneumoperitoneum. 3. Support apparatus, as above. Electronically Signed   By: Trudie Reed M.D.   On: 05/25/2018 12:59    Assessment/Plan Active Problems:   Acute on chronic respiratory failure with hypoxia (HCC)   COPD, severe (HCC)   Rheumatoid arthritis (HCC)   Steroid-dependent chronic obstructive pulmonary disease (HCC)   Atrial fibrillation, chronic   1. Acute on chronic respiratory failure with hypoxia continue trach collar trials as tolerated.  Titrate oxygen continue [place] toilet per protocol. 2. Severe COPD at baseline continue  present management 3. Rheumatoid arthritis stable 4. Steroid dependence weaning prednisone 5. Chronic atrial fibrillation rate controlled   I have personally seen and evaluated the patient, evaluated laboratory and imaging results, formulated the assessment and plan and placed orders. The Patient requires high complexity decision making for assessment and support.  Case  was discussed on Rounds with the Respiratory Therapy Staff  Yevonne PaxSaadat A Khan, MD [facility] Pulmonary Critical Care Medicine Sleep Medicine

## 2018-05-26 DIAGNOSIS — J449 Chronic obstructive pulmonary disease, unspecified: Secondary | ICD-10-CM | POA: Diagnosis not present

## 2018-05-26 DIAGNOSIS — J9621 Acute and chronic respiratory failure with hypoxia: Secondary | ICD-10-CM | POA: Diagnosis not present

## 2018-05-26 DIAGNOSIS — I482 Chronic atrial fibrillation, unspecified: Secondary | ICD-10-CM | POA: Diagnosis not present

## 2018-05-26 DIAGNOSIS — M069 Rheumatoid arthritis, unspecified: Secondary | ICD-10-CM | POA: Diagnosis not present

## 2018-05-26 NOTE — Progress Notes (Addendum)
Pulmonary Critical Care Medicine [facility] [place]   PULMONARY CRITICAL CARE SERVICE  PROGRESS NOTE  Date of Service: 05/26/2018  Brittney Lowe  [id number]  DOB: 17-May-1950   DOA: 04/27/2018  Referring Physician: Carron Curie, MD  HPI: Brittney Lowe is a 68 y.o. female seen for follow up of Acute on Chronic Respiratory Failure.  Patient did well on trach collar 35% FiO2 yesterday.  However overnight began to have significant arrhythmias and continues to complain of pain.  These episodes seem to increase intermittently, with increases in tube feedings.  Patient is using PMV without difficulty.  Medications: Reviewed on Rounds  Physical Exam:  Vitals: Pulse 71 respirations 21 BP 116/61 O2 sat 97% temp 98.6  Ventilator Settings patient's not currently on ventilator  . General: Comfortable at this time . Eyes: Grossly normal lids, irises & conjunctiva . ENT: grossly tongue is normal . Neck: no obvious mass . Cardiovascular: S1 S2 normal no gallop . Respiratory: Coarse breath sounds . Abdomen: soft . Skin: no rash seen on limited exam . Musculoskeletal: not rigid . Psychiatric:unable to assess . Neurologic: no seizure no involuntary movements         Lab Data:   Basic Metabolic Panel: Recent Labs  Lab 05/21/18 1536  NA 141  K 3.8  CL 101  CO2 32  GLUCOSE 190*  BUN 16  CREATININE 0.37*  CALCIUM 9.0  MG 1.8    ABG: No results for input(s): PHART, PCO2ART, PO2ART, HCO3, O2SAT in the last 168 hours.  Liver Function Tests: No results for input(s): AST, ALT, ALKPHOS, BILITOT, PROT, ALBUMIN in the last 168 hours. No results for input(s): LIPASE, AMYLASE in the last 168 hours. No results for input(s): AMMONIA in the last 168 hours.  CBC: Recent Labs  Lab 05/20/18 0705  WBC 14.4*  HGB 7.4*  HCT 26.4*  MCV 93.6  PLT 503*    Cardiac Enzymes: No results for input(s): CKTOTAL, CKMB, CKMBINDEX, TROPONINI in the last 168 hours.  BNP  (last 3 results) No results for input(s): BNP in the last 8760 hours.  ProBNP (last 3 results) No results for input(s): PROBNP in the last 8760 hours.  Radiological Exams: Dg Abd Portable 1v  Result Date: 05/25/2018 CLINICAL DATA:  68 year old female with history of left-sided abdominal pain for the past 3 days. EXAM: PORTABLE ABDOMEN - 1 VIEW COMPARISON:  05/17/2018. FINDINGS: Percutaneous gastrostomy tube projecting over the left upper quadrant of the abdomen. Gas and stool noted throughout the colon extending to the level of the distal rectum. No pathologic dilatation of small bowel. No pneumoperitoneum. IMPRESSION: 1. Nonobstructive bowel gas pattern. 2. No pneumoperitoneum. 3. Support apparatus, as above. Electronically Signed   By: Trudie Reed M.D.   On: 05/25/2018 12:59    Assessment/Plan Active Problems:   Acute on chronic respiratory failure with hypoxia (HCC)   COPD, severe (HCC)   Rheumatoid arthritis (HCC)   Steroid-dependent chronic obstructive pulmonary disease (HCC)   Atrial fibrillation, chronic   1. Acute on chronic respiratory failure with hypoxia continue trach collar trials as tolerated.  Titrate oxygen continue pulmonary toilet per protocol. 2. Severe COPD at baseline continue present management 3. Rheumatoid arthritis stable 4. Steroid dependence weaning prednisone currently. 5. Chronic atrial fibrillation rate controlled   I have personally seen and evaluated the patient, evaluated laboratory and imaging results, formulated the assessment and plan and placed orders. The Patient requires high complexity decision making for assessment and support.  Case was discussed  on Rounds with the Respiratory Therapy Staff  Allyne Gee, MD [facility] Pulmonary Critical Care Medicine Sleep Medicine

## 2018-05-27 DIAGNOSIS — M069 Rheumatoid arthritis, unspecified: Secondary | ICD-10-CM | POA: Diagnosis not present

## 2018-05-27 DIAGNOSIS — J9621 Acute and chronic respiratory failure with hypoxia: Secondary | ICD-10-CM | POA: Diagnosis not present

## 2018-05-27 DIAGNOSIS — J449 Chronic obstructive pulmonary disease, unspecified: Secondary | ICD-10-CM | POA: Diagnosis not present

## 2018-05-27 DIAGNOSIS — I482 Chronic atrial fibrillation, unspecified: Secondary | ICD-10-CM | POA: Diagnosis not present

## 2018-05-27 NOTE — Progress Notes (Signed)
Pulmonary Critical Care Medicine [facility] [place]   PULMONARY CRITICAL CARE SERVICE  PROGRESS NOTE  Date of Service: 05/27/2018  Brittney Lowe  [id number]  DOB: May 27, 1950   DOA: 04/27/2018  Referring Physician: Carron Curie, MD  HPI: Brittney Lowe is a 68 y.o. female seen for follow up of Acute on Chronic Respiratory Failure.  Currently patient is on T collar has been on 28% oxygen with good saturations.  Medications: Reviewed on Rounds  Physical Exam:  Vitals: Temperature is 98.4 pulse 106 respiratory rate 25 blood pressure 136/77 saturation 99%  Ventilator Settings off the ventilator on T collar right now  . General: Comfortable at this time . Eyes: Grossly normal lids, irises & conjunctiva . ENT: grossly tongue is normal . Neck: no obvious mass . Cardiovascular: S1 S2 normal no gallop . Respiratory: No rhonchi or rales are noted at this time . Abdomen: soft . Skin: no rash seen on limited exam . Musculoskeletal: not rigid . Psychiatric:unable to assess . Neurologic: no seizure no involuntary movements         Lab Data:   Basic Metabolic Panel: Recent Labs  Lab 05/21/18 1536  NA 141  K 3.8  CL 101  CO2 32  GLUCOSE 190*  BUN 16  CREATININE 0.37*  CALCIUM 9.0  MG 1.8    ABG: No results for input(s): PHART, PCO2ART, PO2ART, HCO3, O2SAT in the last 168 hours.  Liver Function Tests: No results for input(s): AST, ALT, ALKPHOS, BILITOT, PROT, ALBUMIN in the last 168 hours. No results for input(s): LIPASE, AMYLASE in the last 168 hours. No results for input(s): AMMONIA in the last 168 hours.  CBC: No results for input(s): WBC, NEUTROABS, HGB, HCT, MCV, PLT in the last 168 hours.  Cardiac Enzymes: No results for input(s): CKTOTAL, CKMB, CKMBINDEX, TROPONINI in the last 168 hours.  BNP (last 3 results) No results for input(s): BNP in the last 8760 hours.  ProBNP (last 3 results) No results for input(s): PROBNP in the last 8760  hours.  Radiological Exams: No results found.  Assessment/Plan Active Problems:   Acute on chronic respiratory failure with hypoxia (HCC)   COPD, severe (HCC)   Rheumatoid arthritis (HCC)   Steroid-dependent chronic obstructive pulmonary disease (HCC)   Atrial fibrillation, chronic   1. Acute on chronic respiratory failure with hypoxia we will continue to wean on T collar patient's not able to decannulate 2. Severe COPD advanced disease continue present management 3. Steroid dependence right now weaning 4. Chronic atrial fibrillation rate controlled 5. Rheumatoid arthritis at baseline   I have personally seen and evaluated the patient, evaluated laboratory and imaging results, formulated the assessment and plan and placed orders. The Patient requires high complexity decision making for assessment and support.  Case was discussed on Rounds with the Respiratory Therapy Staff  Yevonne Pax, MD [facility] Pulmonary Critical Care Medicine Sleep Medicine

## 2018-05-28 ENCOUNTER — Other Ambulatory Visit: Payer: Self-pay

## 2018-05-28 DIAGNOSIS — I482 Chronic atrial fibrillation, unspecified: Secondary | ICD-10-CM | POA: Diagnosis not present

## 2018-05-28 DIAGNOSIS — M069 Rheumatoid arthritis, unspecified: Secondary | ICD-10-CM | POA: Diagnosis not present

## 2018-05-28 DIAGNOSIS — J9621 Acute and chronic respiratory failure with hypoxia: Secondary | ICD-10-CM | POA: Diagnosis not present

## 2018-05-28 DIAGNOSIS — J449 Chronic obstructive pulmonary disease, unspecified: Secondary | ICD-10-CM | POA: Diagnosis not present

## 2018-05-28 LAB — CBC
HEMATOCRIT: 26.9 % — AB (ref 36.0–46.0)
Hemoglobin: 7.5 g/dL — ABNORMAL LOW (ref 12.0–15.0)
MCH: 24.8 pg — ABNORMAL LOW (ref 26.0–34.0)
MCHC: 27.9 g/dL — ABNORMAL LOW (ref 30.0–36.0)
MCV: 89.1 fL (ref 80.0–100.0)
NRBC: 0 % (ref 0.0–0.2)
Platelets: 575 10*3/uL — ABNORMAL HIGH (ref 150–400)
RBC: 3.02 MIL/uL — ABNORMAL LOW (ref 3.87–5.11)
RDW: 16.7 % — ABNORMAL HIGH (ref 11.5–15.5)
WBC: 9.8 10*3/uL (ref 4.0–10.5)

## 2018-05-28 LAB — BASIC METABOLIC PANEL
Anion gap: 7 (ref 5–15)
BUN: 10 mg/dL (ref 8–23)
CHLORIDE: 101 mmol/L (ref 98–111)
CO2: 34 mmol/L — ABNORMAL HIGH (ref 22–32)
Calcium: 8.7 mg/dL — ABNORMAL LOW (ref 8.9–10.3)
Glucose, Bld: 133 mg/dL — ABNORMAL HIGH (ref 70–99)
Potassium: 3 mmol/L — ABNORMAL LOW (ref 3.5–5.1)
Sodium: 142 mmol/L (ref 135–145)

## 2018-05-28 LAB — MAGNESIUM: Magnesium: 1.7 mg/dL (ref 1.7–2.4)

## 2018-05-28 NOTE — Progress Notes (Signed)
Pulmonary Critical Care Medicine [facility] [place]   PULMONARY CRITICAL CARE SERVICE  PROGRESS NOTE  Date of Service: 05/28/2018  KAREN SARRATT  [id number]  DOB: 13-Apr-1951   DOA: 04/27/2018  Referring Physician: Carron Curie, MD  HPI: Brittney Lowe is a 68 y.o. female seen for follow up of Acute on Chronic Respiratory Failure.  Patient is 40% FiO2 remains on T collar at this point I think this is going to be her baseline  Medications: Reviewed on Rounds  Physical Exam:  Vitals: Temperature 98.2 pulse 73 respiratory 20 blood pressure 129/75 saturations 98%  Ventilator Settings off the ventilator on T collar now 40% FiO2  . General: Comfortable at this time . Eyes: Grossly normal lids, irises & conjunctiva . ENT: grossly tongue is normal . Neck: no obvious mass . Cardiovascular: S1 S2 normal no gallop . Respiratory: No rhonchi or rales are noted at this time . Abdomen: soft . Skin: no rash seen on limited exam . Musculoskeletal: not rigid . Psychiatric:unable to assess . Neurologic: no seizure no involuntary movements         Lab Data:   Basic Metabolic Panel: Recent Labs  Lab 05/21/18 1536 05/28/18 0521  NA 141 142  K 3.8 3.0*  CL 101 101  CO2 32 34*  GLUCOSE 190* 133*  BUN 16 10  CREATININE 0.37* <0.30*  CALCIUM 9.0 8.7*  MG 1.8 1.7    ABG: No results for input(s): PHART, PCO2ART, PO2ART, HCO3, O2SAT in the last 168 hours.  Liver Function Tests: No results for input(s): AST, ALT, ALKPHOS, BILITOT, PROT, ALBUMIN in the last 168 hours. No results for input(s): LIPASE, AMYLASE in the last 168 hours. No results for input(s): AMMONIA in the last 168 hours.  CBC: Recent Labs  Lab 05/28/18 0521  WBC 9.8  HGB 7.5*  HCT 26.9*  MCV 89.1  PLT 575*    Cardiac Enzymes: No results for input(s): CKTOTAL, CKMB, CKMBINDEX, TROPONINI in the last 168 hours.  BNP (last 3 results) No results for input(s): BNP in the last 8760  hours.  ProBNP (last 3 results) No results for input(s): PROBNP in the last 8760 hours.  Radiological Exams: No results found.  Assessment/Plan Active Problems:   Acute on chronic respiratory failure with hypoxia (HCC)   COPD, severe (HCC)   Rheumatoid arthritis (HCC)   Steroid-dependent chronic obstructive pulmonary disease (HCC)   Atrial fibrillation, chronic   1. Acute on chronic respiratory failure with hypoxia patient is on 40% FiO2 right now I think this is going to be the baseline as mentioned.  Continue pulmonary toilet secretion management. 2. Severe COPD at baseline we will continue with supportive cares she has severe disease 3. 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 Chronic atrial fibrillation is rate is controlled   I have personally seen and evaluated the patient, evaluated laboratory and imaging results, formulated the assessment and plan and placed orders. The Patient requires high complexity decision making for assessment and support.  Case was discussed on Rounds with the Respiratory Therapy Staff  Yevonne Pax, MD [facility] Pulmonary Critical Care Medicine Sleep Medicine

## 2018-05-29 DIAGNOSIS — I482 Chronic atrial fibrillation, unspecified: Secondary | ICD-10-CM | POA: Diagnosis not present

## 2018-05-29 DIAGNOSIS — M069 Rheumatoid arthritis, unspecified: Secondary | ICD-10-CM | POA: Diagnosis not present

## 2018-05-29 DIAGNOSIS — J449 Chronic obstructive pulmonary disease, unspecified: Secondary | ICD-10-CM | POA: Diagnosis not present

## 2018-05-29 DIAGNOSIS — J9621 Acute and chronic respiratory failure with hypoxia: Secondary | ICD-10-CM | POA: Diagnosis not present

## 2018-05-29 NOTE — Progress Notes (Addendum)
Pulmonary Critical Care Medicine [facility] [place]   PULMONARY CRITICAL CARE SERVICE  PROGRESS NOTE  Date of Service: 05/29/2018  Brittney Lowe  [id number]  DOB: Jul 02, 1950   DOA: 04/27/2018  Referring Physician: Carron Curie, MD  HPI: Brittney Lowe is a 68 y.o. female seen for follow up of Acute on Chronic Respiratory Failure.  Patient mains on 40% FiO2 trach collar.  This continues to suit her well and will mostly be the baseline for her going forward.  She is able to tolerate a PMV using 2 L of oxygen via nasal cannula with no distress.  Medications: Reviewed on Rounds  Physical Exam:  Vitals: Pulse 80 impressions 15 BP 175/53 O2 sat 97% temp 97.8  Ventilator Settings patient's not currently on ventilator  . General: Comfortable at this time . Eyes: Grossly normal lids, irises & conjunctiva . ENT: grossly tongue is normal . Neck: no obvious mass . Cardiovascular: S1 S2 normal no gallop . Respiratory: No rales or rhonchi noted . Abdomen: soft . Skin: no rash seen on limited exam . Musculoskeletal: not rigid . Psychiatric:unable to assess . Neurologic: no seizure no involuntary movements         Lab Data:   Basic Metabolic Panel: Recent Labs  Lab 05/28/18 0521  NA 142  K 3.0*  CL 101  CO2 34*  GLUCOSE 133*  BUN 10  CREATININE <0.30*  CALCIUM 8.7*  MG 1.7    ABG: No results for input(s): PHART, PCO2ART, PO2ART, HCO3, O2SAT in the last 168 hours.  Liver Function Tests: No results for input(s): AST, ALT, ALKPHOS, BILITOT, PROT, ALBUMIN in the last 168 hours. No results for input(s): LIPASE, AMYLASE in the last 168 hours. No results for input(s): AMMONIA in the last 168 hours.  CBC: Recent Labs  Lab 05/28/18 0521  WBC 9.8  HGB 7.5*  HCT 26.9*  MCV 89.1  PLT 575*    Cardiac Enzymes: No results for input(s): CKTOTAL, CKMB, CKMBINDEX, TROPONINI in the last 168 hours.  BNP (last 3 results) No results for input(s): BNP in  the last 8760 hours.  ProBNP (last 3 results) No results for input(s): PROBNP in the last 8760 hours.  Radiological Exams: No results found.  Assessment/Plan Active Problems:   Acute on chronic respiratory failure with hypoxia (HCC)   COPD, severe (HCC)   Rheumatoid arthritis (HCC)   Steroid-dependent chronic obstructive pulmonary disease (HCC)   Atrial fibrillation, chronic   1. Acute on chronic respiratory failure with hypoxia patient continues to require 40% FiO2 via trach collar.  The most likely the the lowest we are able to wean her.  Continue supportive measures and aggressive pulmonary toilet and secretion management. 2. Severe COPD at baseline we will continue supportive care 3. Rheumatoid arthritis stable 4. Steroid dependence continue present management 5. Chronic atrial fibrillation rate controlled   I have personally seen and evaluated the patient, evaluated laboratory and imaging results, formulated the assessment and plan and placed orders. The Patient requires high complexity decision making for assessment and support.  Case was discussed on Rounds with the Respiratory Therapy Staff  Yevonne Pax, MD [facility] Pulmonary Critical Care Medicine Sleep Medicine

## 2018-05-30 DIAGNOSIS — J9621 Acute and chronic respiratory failure with hypoxia: Secondary | ICD-10-CM | POA: Diagnosis not present

## 2018-05-30 DIAGNOSIS — M069 Rheumatoid arthritis, unspecified: Secondary | ICD-10-CM | POA: Diagnosis not present

## 2018-05-30 DIAGNOSIS — J449 Chronic obstructive pulmonary disease, unspecified: Secondary | ICD-10-CM | POA: Diagnosis not present

## 2018-05-30 DIAGNOSIS — I482 Chronic atrial fibrillation, unspecified: Secondary | ICD-10-CM | POA: Diagnosis not present

## 2018-05-30 LAB — BASIC METABOLIC PANEL
Anion gap: 10 (ref 5–15)
BUN: 8 mg/dL (ref 8–23)
CO2: 32 mmol/L (ref 22–32)
Calcium: 9.3 mg/dL (ref 8.9–10.3)
Chloride: 100 mmol/L (ref 98–111)
Creatinine, Ser: <0.3 mg/dL — ABNORMAL LOW (ref 0.44–1.00)
Glucose, Bld: 116 mg/dL — ABNORMAL HIGH (ref 70–99)
Potassium: 3.8 mmol/L (ref 3.5–5.1)
Sodium: 142 mmol/L (ref 135–145)

## 2018-05-30 LAB — MAGNESIUM: Magnesium: 1.8 mg/dL (ref 1.7–2.4)

## 2018-05-30 NOTE — Progress Notes (Addendum)
Pulmonary Critical Care Medicine [facility] [place]   PULMONARY CRITICAL CARE SERVICE  PROGRESS NOTE  Date of Service: 05/30/2018  Brittney Lowe  [id number]  DOB: [birth year]   DOA: 04/27/2018  Referring Physician: Carron Curie, MD  HPI: Brittney Lowe is a 68 y.o. female seen for follow up of Acute on Chronic Respiratory Failure.  Patient continues today on 40% FiO2 trach collar.  Using PMV without difficulty typically with 2 L of oxygen via nasal cannula due to decreasing sats with PMV.  She currently has no distress.  Medications: Reviewed on Rounds  Physical Exam:  Vitals: Pulse 75 respirations 20 BP 172/85 O2 sat 96% temp 97.4  Ventilator Settings patient's not currently on ventilator  . General: Comfortable at this time . Eyes: Grossly normal lids, irises & conjunctiva . ENT: grossly tongue is normal . Neck: no obvious mass . Cardiovascular: S1 S2 normal no gallop . Respiratory: No rales or rhonchi noted . Abdomen: soft . Skin: no rash seen on limited exam . Musculoskeletal: not rigid . Psychiatric:unable to assess . Neurologic: no seizure no involuntary movements         Lab Data:   Basic Metabolic Panel: Recent Labs  Lab 05/28/18 0521 05/30/18 0612  NA 142 142  K 3.0* 3.8  CL 101 100  CO2 34* 32  GLUCOSE 133* 116*  BUN 10 8  CREATININE <0.30* <0.30*  CALCIUM 8.7* 9.3  MG 1.7 1.8    ABG: No results for input(s): PHART, PCO2ART, PO2ART, HCO3, O2SAT in the last 168 hours.  Liver Function Tests: No results for input(s): AST, ALT, ALKPHOS, BILITOT, PROT, ALBUMIN in the last 168 hours. No results for input(s): LIPASE, AMYLASE in the last 168 hours. No results for input(s): AMMONIA in the last 168 hours.  CBC: Recent Labs  Lab 05/28/18 0521  WBC 9.8  HGB 7.5*  HCT 26.9*  MCV 89.1  PLT 575*    Cardiac Enzymes: No results for input(s): CKTOTAL, CKMB, CKMBINDEX, TROPONINI in the last 168 hours.  BNP (last 3 results) No  results for input(s): BNP in the last 8760 hours.  ProBNP (last 3 results) No results for input(s): PROBNP in the last 8760 hours.  Radiological Exams: No results found.  Assessment/Plan Active Problems:   Acute on chronic respiratory failure with hypoxia (HCC)   COPD, severe (HCC)   Rheumatoid arthritis (HCC)   Steroid-dependent chronic obstructive pulmonary disease (HCC)   Atrial fibrillation, chronic   1. Acute on chronic respiratory failure with hypoxia patient continues to require 40% T collar.  When using speaking valve she uses 2 L via nasal cannula with good results.  Continue supportive measures and aggressive pulmonary toilet for secretion management. 2. Severe COPD at baseline we will continue supportive care 3. Rheumatoid arthritis stable 4. Steroid dependence continue present management 5. Chronic atrial fibrillation rate controlled   I have personally seen and evaluated the patient, evaluated laboratory and imaging results, formulated the assessment and plan and placed orders. The Patient requires high complexity decision making for assessment and support.  Case was discussed on Rounds with the Respiratory Therapy Staff  Yevonne Pax, MD [facility] Pulmonary Critical Care Medicine Sleep Medicine

## 2018-05-31 DIAGNOSIS — J449 Chronic obstructive pulmonary disease, unspecified: Secondary | ICD-10-CM | POA: Diagnosis not present

## 2018-05-31 DIAGNOSIS — M069 Rheumatoid arthritis, unspecified: Secondary | ICD-10-CM | POA: Diagnosis not present

## 2018-05-31 DIAGNOSIS — J9621 Acute and chronic respiratory failure with hypoxia: Secondary | ICD-10-CM | POA: Diagnosis not present

## 2018-05-31 DIAGNOSIS — I482 Chronic atrial fibrillation, unspecified: Secondary | ICD-10-CM | POA: Diagnosis not present

## 2018-05-31 NOTE — Progress Notes (Addendum)
Pulmonary Critical Care Medicine [facility] [place]   PULMONARY CRITICAL CARE SERVICE  PROGRESS NOTE  Date of Service: 05/31/2018  Brittney Lowe  [id number]  DOB: May 14, 1950   DOA: 04/27/2018  Referring Physician: Carron Curie, MD  HPI: Brittney Lowe is a 68 y.o. female seen for follow up of Acute on Chronic Respiratory Failure.  Patient continues on trach collar 40% FiO2.  And also 2 L of oxygen via nasal cannula.  She uses PMV intermittently.  No acute distress at this time.  Medications: Reviewed on Rounds  Physical Exam:  Vitals: Pulse 88 respirations 23 BP 149/58 O2 sat 95% temp 98.6  Ventilator Settings patient's not currently on ventilator  . General: Comfortable at this time . Eyes: Grossly normal lids, irises & conjunctiva . ENT: grossly tongue is normal . Neck: no obvious mass . Cardiovascular: S1 S2 normal no gallop . Respiratory: No rales or rhonchi noted . Abdomen: soft . Skin: no rash seen on limited exam . Musculoskeletal: not rigid . Psychiatric:unable to assess . Neurologic: no seizure no involuntary movements         Lab Data:   Basic Metabolic Panel: Recent Labs  Lab 05/28/18 0521 05/30/18 0612  NA 142 142  K 3.0* 3.8  CL 101 100  CO2 34* 32  GLUCOSE 133* 116*  BUN 10 8  CREATININE <0.30* <0.30*  CALCIUM 8.7* 9.3  MG 1.7 1.8    ABG: No results for input(s): PHART, PCO2ART, PO2ART, HCO3, O2SAT in the last 168 hours.  Liver Function Tests: No results for input(s): AST, ALT, ALKPHOS, BILITOT, PROT, ALBUMIN in the last 168 hours. No results for input(s): LIPASE, AMYLASE in the last 168 hours. No results for input(s): AMMONIA in the last 168 hours.  CBC: Recent Labs  Lab 05/28/18 0521  WBC 9.8  HGB 7.5*  HCT 26.9*  MCV 89.1  PLT 575*    Cardiac Enzymes: No results for input(s): CKTOTAL, CKMB, CKMBINDEX, TROPONINI in the last 168 hours.  BNP (last 3 results) No results for input(s): BNP in the last 8760  hours.  ProBNP (last 3 results) No results for input(s): PROBNP in the last 8760 hours.  Radiological Exams: No results found.  Assessment/Plan Active Problems:   Acute on chronic respiratory failure with hypoxia (HCC)   COPD, severe (HCC)   Rheumatoid arthritis (HCC)   Steroid-dependent chronic obstructive pulmonary disease (HCC)   Atrial fibrillation, chronic   1. Acute on chronic respiratory failure with hypoxia patient continues to require T collar 40% FiO2.  She is also using 2 L of oxygen via nasal cannula with speaking valve and without.  Patient continues to have arrhythmias when oxygen is decreased from here.  Continue supportive measures aggressive pulmonary toilet and secretion management. 2. Severe COPD at baseline we will continue supportive care 3. Rheumatoid arthritis stable 4. Steroid dependence continue present management 5. Chronic atrial fibrillation rate controlled   I have personally seen and evaluated the patient, evaluated laboratory and imaging results, formulated the assessment and plan and placed orders. The Patient requires high complexity decision making for assessment and support.  Case was discussed on Rounds with the Respiratory Therapy Staff  Yevonne Pax, MD [facility] Pulmonary Critical Care Medicine Sleep Medicine

## 2018-06-01 DIAGNOSIS — J9621 Acute and chronic respiratory failure with hypoxia: Secondary | ICD-10-CM | POA: Diagnosis not present

## 2018-06-01 DIAGNOSIS — J449 Chronic obstructive pulmonary disease, unspecified: Secondary | ICD-10-CM | POA: Diagnosis not present

## 2018-06-01 DIAGNOSIS — M069 Rheumatoid arthritis, unspecified: Secondary | ICD-10-CM | POA: Diagnosis not present

## 2018-06-01 DIAGNOSIS — I482 Chronic atrial fibrillation, unspecified: Secondary | ICD-10-CM | POA: Diagnosis not present

## 2018-06-01 NOTE — Progress Notes (Addendum)
Pulmonary Critical Care Medicine [facility] [place]   PULMONARY CRITICAL CARE SERVICE  PROGRESS NOTE  Date of Service: 06/01/2018  Brittney Lowe  [id number]  DOB: December 16, 1950   DOA: 04/27/2018  Referring Physician: Carron Curie, MD  HPI: Brittney Lowe is a 68 y.o. female seen for follow up of Acute on Chronic Respiratory Failure.  Patient remains on trach collar.  FiO2 decreased to 35%.  Patient also has 2 L of oxygen via nasal cannula going simultaneously.  She uses a PMV intermittently and asked to have the nasal cannula for that.  No acute distress noted at this time.  Medications: Reviewed on Rounds  Physical Exam:  Vitals: Pulse 91 respirations 16 BP 117/58 O2 sat on percent temp 98.0  Ventilator Settings patient is currently on ventilator  . General: Comfortable at this time . Eyes: Grossly normal lids, irises & conjunctiva . ENT: grossly tongue is normal . Neck: no obvious mass . Cardiovascular: S1 S2 normal no gallop . Respiratory: No rales or rhonchi noted . Abdomen: soft . Skin: no rash seen on limited exam . Musculoskeletal: not rigid . Psychiatric:unable to assess . Neurologic: no seizure no involuntary movements         Lab Data:   Basic Metabolic Panel: Recent Labs  Lab 05/28/18 0521 05/30/18 0612  NA 142 142  K 3.0* 3.8  CL 101 100  CO2 34* 32  GLUCOSE 133* 116*  BUN 10 8  CREATININE <0.30* <0.30*  CALCIUM 8.7* 9.3  MG 1.7 1.8    ABG: No results for input(s): PHART, PCO2ART, PO2ART, HCO3, O2SAT in the last 168 hours.  Liver Function Tests: No results for input(s): AST, ALT, ALKPHOS, BILITOT, PROT, ALBUMIN in the last 168 hours. No results for input(s): LIPASE, AMYLASE in the last 168 hours. No results for input(s): AMMONIA in the last 168 hours.  CBC: Recent Labs  Lab 05/28/18 0521  WBC 9.8  HGB 7.5*  HCT 26.9*  MCV 89.1  PLT 575*    Cardiac Enzymes: No results for input(s): CKTOTAL, CKMB, CKMBINDEX,  TROPONINI in the last 168 hours.  BNP (last 3 results) No results for input(s): BNP in the last 8760 hours.  ProBNP (last 3 results) No results for input(s): PROBNP in the last 8760 hours.  Radiological Exams: No results found.  Assessment/Plan Active Problems:   Acute on chronic respiratory failure with hypoxia (HCC)   COPD, severe (HCC)   Rheumatoid arthritis (HCC)   Steroid-dependent chronic obstructive pulmonary disease (HCC)   Atrial fibrillation, chronic   1. Acute on chronic respiratory failure with hypoxia patient continues to require T collar 35% along with 2 L of oxygen via nasal cannula.  She tolerates PMV at times.  Continue supportive measures and aggressive pulmonary toilet with cell management. 2. Severe COPD at baseline continue supportive care 3. Rheumatoid arthritis stable 4. Steroid dependence continue present management 5. Chronic atrial fibrillation rate controlled   I have personally seen and evaluated the patient, evaluated laboratory and imaging results, formulated the assessment and plan and placed orders. The Patient requires high complexity decision making for assessment and support.  Case was discussed on Rounds with the Respiratory Therapy Staff  Yevonne Pax, MD [facility] Pulmonary Critical Care Medicine Sleep Medicine

## 2018-06-02 DIAGNOSIS — I482 Chronic atrial fibrillation, unspecified: Secondary | ICD-10-CM | POA: Diagnosis not present

## 2018-06-02 DIAGNOSIS — J449 Chronic obstructive pulmonary disease, unspecified: Secondary | ICD-10-CM | POA: Diagnosis not present

## 2018-06-02 DIAGNOSIS — M069 Rheumatoid arthritis, unspecified: Secondary | ICD-10-CM | POA: Diagnosis not present

## 2018-06-02 DIAGNOSIS — J9621 Acute and chronic respiratory failure with hypoxia: Secondary | ICD-10-CM | POA: Diagnosis not present

## 2018-06-02 NOTE — Progress Notes (Addendum)
Pulmonary Critical Care Medicine [facility] [place]   PULMONARY CRITICAL CARE SERVICE  PROGRESS NOTE  Date of Service: 06/02/2018  Brittney Lowe  [id number]  DOB: 04-12-1951   DOA: 04/27/2018  Referring Physician: Carron Curie, MD  HPI: Brittney Lowe is a 68 y.o. female seen for follow up of Acute on Chronic Respiratory Failure.  Patient remains on T collar 40% FiO2 with 2 L of oxygen via nasal cannula.  Patient's cardiac events and arrhythmias are increasing.  Her Cardizem has been increased.  Medications: Reviewed on Rounds  Physical Exam:  Vitals: Pulse 59 respirations 19 BP 117/59 O2 sat 98% temp 97.5  Ventilator Settings patient's not currently on ventilator  . General: Comfortable at this time . Eyes: Grossly normal lids, irises & conjunctiva . ENT: grossly tongue is normal . Neck: no obvious mass . Cardiovascular: S1 S2 normal no gallop . Respiratory: No rales or rhonchi noted . Abdomen: soft . Skin: no rash seen on limited exam . Musculoskeletal: not rigid . Psychiatric:unable to assess . Neurologic: no seizure no involuntary movements         Lab Data:   Basic Metabolic Panel: Recent Labs  Lab 05/28/18 0521 05/30/18 0612  NA 142 142  K 3.0* 3.8  CL 101 100  CO2 34* 32  GLUCOSE 133* 116*  BUN 10 8  CREATININE <0.30* <0.30*  CALCIUM 8.7* 9.3  MG 1.7 1.8    ABG: No results for input(s): PHART, PCO2ART, PO2ART, HCO3, O2SAT in the last 168 hours.  Liver Function Tests: No results for input(s): AST, ALT, ALKPHOS, BILITOT, PROT, ALBUMIN in the last 168 hours. No results for input(s): LIPASE, AMYLASE in the last 168 hours. No results for input(s): AMMONIA in the last 168 hours.  CBC: Recent Labs  Lab 05/28/18 0521  WBC 9.8  HGB 7.5*  HCT 26.9*  MCV 89.1  PLT 575*    Cardiac Enzymes: No results for input(s): CKTOTAL, CKMB, CKMBINDEX, TROPONINI in the last 168 hours.  BNP (last 3 results) No results for input(s): BNP  in the last 8760 hours.  ProBNP (last 3 results) No results for input(s): PROBNP in the last 8760 hours.  Radiological Exams: No results found.  Assessment/Plan Active Problems:   Acute on chronic respiratory failure with hypoxia (HCC)   COPD, severe (HCC)   Rheumatoid arthritis (HCC)   Steroid-dependent chronic obstructive pulmonary disease (HCC)   Atrial fibrillation, chronic   1. Acute on chronic respiratory failure with hypoxia patient continues to require T collar at 40% as well as 2 L of oxygen via nasal cannula.  Tolerates PMV intermittently for short periods of time.  Continue supportive measures and aggressive pulmonary toilet. 2. Severe COPD at baseline continue supportive care 3. Rheumatoid arthritis stable 4. Steroid dependence continue present management 5. Chronic atrial fibrillation rate controlled   I have personally seen and evaluated the patient, evaluated laboratory and imaging results, formulated the assessment and plan and placed orders. The Patient requires high complexity decision making for assessment and support.  Case was discussed on Rounds with the Respiratory Therapy Staff  Yevonne Pax, MD [facility] Pulmonary Critical Care Medicine Sleep Medicine

## 2018-06-03 DIAGNOSIS — J9621 Acute and chronic respiratory failure with hypoxia: Secondary | ICD-10-CM | POA: Diagnosis not present

## 2018-06-03 DIAGNOSIS — M069 Rheumatoid arthritis, unspecified: Secondary | ICD-10-CM | POA: Diagnosis not present

## 2018-06-03 DIAGNOSIS — J449 Chronic obstructive pulmonary disease, unspecified: Secondary | ICD-10-CM | POA: Diagnosis not present

## 2018-06-03 DIAGNOSIS — I482 Chronic atrial fibrillation, unspecified: Secondary | ICD-10-CM | POA: Diagnosis not present

## 2018-06-03 LAB — DIGOXIN LEVEL: Digoxin Level: 0.9 ng/mL (ref 0.8–2.0)

## 2018-06-03 NOTE — Progress Notes (Signed)
Pulmonary Critical Care Medicine [facility] [place]   PULMONARY CRITICAL CARE SERVICE  PROGRESS NOTE  Date of Service: 06/03/2018  Brittney Lowe  QIW:[id number]  DOB: 12-03-1950   DOA: 04/27/2018  Referring Physician: Carron Curie, MD  HPI: Brittney Lowe is a 68 y.o. female seen for follow up of Acute on Chronic Respiratory Failure.  Currently patient is on T collar has been on 30% FiO2  Medications: Reviewed on Rounds  Physical Exam:  Vitals: Temperature 98.0 pulse 87 respiratory 30 blood pressure 110/65 saturations 95%  Ventilator Settings off ventilator on T collar  . General: Comfortable at this time . Eyes: Grossly normal lids, irises & conjunctiva . ENT: grossly tongue is normal . Neck: no obvious mass . Cardiovascular: S1 S2 normal no gallop . Respiratory: Scattered distant rhonchi are noted . Abdomen: soft . Skin: no rash seen on limited exam . Musculoskeletal: not rigid . Psychiatric:unable to assess . Neurologic: no seizure no involuntary movements         Lab Data:   Basic Metabolic Panel: Recent Labs  Lab 05/28/18 0521 05/30/18 0612  NA 142 142  K 3.0* 3.8  CL 101 100  CO2 34* 32  GLUCOSE 133* 116*  BUN 10 8  CREATININE <0.30* <0.30*  CALCIUM 8.7* 9.3  MG 1.7 1.8    ABG: No results for input(s): PHART, PCO2ART, PO2ART, HCO3, O2SAT in the last 168 hours.  Liver Function Tests: No results for input(s): AST, ALT, ALKPHOS, BILITOT, PROT, ALBUMIN in the last 168 hours. No results for input(s): LIPASE, AMYLASE in the last 168 hours. No results for input(s): AMMONIA in the last 168 hours.  CBC: Recent Labs  Lab 05/28/18 0521  WBC 9.8  HGB 7.5*  HCT 26.9*  MCV 89.1  PLT 575*    Cardiac Enzymes: No results for input(s): CKTOTAL, CKMB, CKMBINDEX, TROPONINI in the last 168 hours.  BNP (last 3 results) No results for input(s): BNP in the last 8760 hours.  ProBNP (last 3 results) No results for input(s): PROBNP in the  last 8760 hours.  Radiological Exams: No results found.  Assessment/Plan Active Problems:   Acute on chronic respiratory failure with hypoxia (HCC)   COPD, severe (HCC)   Rheumatoid arthritis (HCC)   Steroid-dependent chronic obstructive pulmonary disease (HCC)   Atrial fibrillation, chronic   1. Acute on chronic respiratory failure with hypoxia we will continue with T collar trials continue pulmonary toilet supportive care 2. Severe COPD continue with nebs and inhalers 3. Rheumatoid arthritis at baseline 4. Steroid dependence continue supportive care 5. Chronic atrial fibrillation rate control at this time   I have personally seen and evaluated the patient, evaluated laboratory and imaging results, formulated the assessment and plan and placed orders. The Patient requires high complexity decision making for assessment and support.  Case was discussed on Rounds with the Respiratory Therapy Staff  Yevonne Pax, MD [facility] Pulmonary Critical Care Medicine Sleep Medicine

## 2018-06-04 ENCOUNTER — Other Ambulatory Visit (HOSPITAL_COMMUNITY): Payer: Medicare HMO

## 2018-06-04 DIAGNOSIS — J449 Chronic obstructive pulmonary disease, unspecified: Secondary | ICD-10-CM | POA: Diagnosis not present

## 2018-06-04 DIAGNOSIS — I482 Chronic atrial fibrillation, unspecified: Secondary | ICD-10-CM | POA: Diagnosis not present

## 2018-06-04 DIAGNOSIS — M069 Rheumatoid arthritis, unspecified: Secondary | ICD-10-CM | POA: Diagnosis not present

## 2018-06-04 DIAGNOSIS — J9621 Acute and chronic respiratory failure with hypoxia: Secondary | ICD-10-CM | POA: Diagnosis not present

## 2018-06-04 NOTE — Progress Notes (Signed)
Pulmonary Critical Care Medicine [facility] [place]   PULMONARY CRITICAL CARE SERVICE  PROGRESS NOTE  Date of Service: 06/04/2018  Brittney Lowe  [id number]:[id number]  DOB: 1951/03/13   DOA: 04/27/2018  Referring Physician: Carron Curie, MD  HPI: Brittney Lowe is a 68 y.o. female seen for follow up of Acute on Chronic Respiratory Failure.  Patient is on T collar currently on 40% FiO2  Medications: Reviewed on Rounds  Physical Exam:  Vitals: Temperature 97.2 pulse 83 respiratory 23 blood pressure 91/54 saturations 98%  Ventilator Settings on T collar FiO2 40%  . General: Comfortable at this time . Eyes: Grossly normal lids, irises & conjunctiva . ENT: grossly tongue is normal . Neck: no obvious mass . Cardiovascular: S1 S2 normal no gallop . Respiratory: Scattered rhonchi expansion is equal . Abdomen: soft . Skin: no rash seen on limited exam . Musculoskeletal: not rigid . Psychiatric:unable to assess . Neurologic: no seizure no involuntary movements         Lab Data:   Basic Metabolic Panel: Recent Labs  Lab 05/30/18 0612  NA 142  K 3.8  CL 100  CO2 32  GLUCOSE 116*  BUN 8  CREATININE <0.30*  CALCIUM 9.3  MG 1.8    ABG: No results for input(s): PHART, PCO2ART, PO2ART, HCO3, O2SAT in the last 168 hours.  Liver Function Tests: No results for input(s): AST, ALT, ALKPHOS, BILITOT, PROT, ALBUMIN in the last 168 hours. No results for input(s): LIPASE, AMYLASE in the last 168 hours. No results for input(s): AMMONIA in the last 168 hours.  CBC: No results for input(s): WBC, NEUTROABS, HGB, HCT, MCV, PLT in the last 168 hours.  Cardiac Enzymes: No results for input(s): CKTOTAL, CKMB, CKMBINDEX, TROPONINI in the last 168 hours.  BNP (last 3 results) No results for input(s): BNP in the last 8760 hours.  ProBNP (last 3 results) No results for input(s): PROBNP in the last 8760 hours.  Radiological Exams: No results  found.  Assessment/Plan Active Problems:   Acute on chronic respiratory failure with hypoxia (HCC)   COPD, severe (HCC)   Rheumatoid arthritis (HCC)   Steroid-dependent chronic obstructive pulmonary disease (HCC)   Atrial fibrillation, chronic   1. Acute on chronic respiratory failure with hypoxia we will continue with T collar trials currently on 40% FiO2 as per respiratory therapy to wean her oxygen down saturations are good 2. Severe COPD advanced disease continue supportive care 3. 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 Rheumatoid arthritis at baseline 5. Chronic atrial fibrillation rate controlled currently   I have personally seen and evaluated the patient, evaluated laboratory and imaging results, formulated the assessment and plan and placed orders. The Patient requires high complexity decision making for assessment and support.  Case was discussed on Rounds with the Respiratory Therapy Staff  Yevonne Pax, MD [facility] Pulmonary Critical Care Medicine Sleep Medicine

## 2018-06-05 DIAGNOSIS — J449 Chronic obstructive pulmonary disease, unspecified: Secondary | ICD-10-CM | POA: Diagnosis not present

## 2018-06-05 DIAGNOSIS — M069 Rheumatoid arthritis, unspecified: Secondary | ICD-10-CM | POA: Diagnosis not present

## 2018-06-05 DIAGNOSIS — J9621 Acute and chronic respiratory failure with hypoxia: Secondary | ICD-10-CM | POA: Diagnosis not present

## 2018-06-05 DIAGNOSIS — I482 Chronic atrial fibrillation, unspecified: Secondary | ICD-10-CM | POA: Diagnosis not present

## 2018-06-05 NOTE — Progress Notes (Signed)
Pulmonary Critical Care Medicine [facility] [place]   PULMONARY CRITICAL CARE SERVICE  PROGRESS NOTE  Date of Service: 06/05/2018  Brittney Lowe  [id number]:[id number]  DOB: 1950/09/18   DOA: 04/27/2018  Referring Physician: Carron Curie, MD  HPI: Brittney Lowe is a 68 y.o. female seen for follow up of Acute on Chronic Respiratory Failure.  Patient right now is on T collar has been on 28% FiO2 saturations are good.  Secretions are fair to moderate  Medications: Reviewed on Rounds  Physical Exam:  Vitals: Temperature 98.4 pulse 78 respiratory 17 blood pressure 140/62 saturations 95%  Ventilator Settings off ventilator on T collar  . General: Comfortable at this time . Eyes: Grossly normal lids, irises & conjunctiva . ENT: grossly tongue is normal . Neck: no obvious mass . Cardiovascular: S1 S2 normal no gallop . Respiratory: No rhonchi or rales are noted at this time . Abdomen: soft . Skin: no rash seen on limited exam . Musculoskeletal: not rigid . Psychiatric:unable to assess . Neurologic: no seizure no involuntary movements         Lab Data:   Basic Metabolic Panel: Recent Labs  Lab 05/30/18 0612  NA 142  K 3.8  CL 100  CO2 32  GLUCOSE 116*  BUN 8  CREATININE <0.30*  CALCIUM 9.3  MG 1.8    ABG: No results for input(s): PHART, PCO2ART, PO2ART, HCO3, O2SAT in the last 168 hours.  Liver Function Tests: No results for input(s): AST, ALT, ALKPHOS, BILITOT, PROT, ALBUMIN in the last 168 hours. No results for input(s): LIPASE, AMYLASE in the last 168 hours. No results for input(s): AMMONIA in the last 168 hours.  CBC: No results for input(s): WBC, NEUTROABS, HGB, HCT, MCV, PLT in the last 168 hours.  Cardiac Enzymes: No results for input(s): CKTOTAL, CKMB, CKMBINDEX, TROPONINI in the last 168 hours.  BNP (last 3 results) No results for input(s): BNP in the last 8760 hours.  ProBNP (last 3 results) No results for input(s): PROBNP in the  last 8760 hours.  Radiological Exams: Dg Chest Port 1 View  Result Date: 06/04/2018 CLINICAL DATA:  Flu like symptoms.  Decreased oxygen saturation EXAM: PORTABLE CHEST 1 VIEW COMPARISON:  05/24/2018 FINDINGS: The heart size and mediastinal contours are within normal limits. Aortic atherosclerosis. The lungs are hyperinflated with coarsened interstitial markings bilaterally. No superimposed airspace consolidation, pulmonary edema or pleural effusion. The visualized skeletal structures are unremarkable. IMPRESSION: No active disease. Aortic atherosclerosis. Electronically Signed   By: Signa Kell M.D.   On: 06/04/2018 11:10    Assessment/Plan Active Problems:   Acute on chronic respiratory failure with hypoxia (HCC)   COPD, severe (HCC)   Rheumatoid arthritis (HCC)   Steroid-dependent chronic obstructive pulmonary disease (HCC)   Atrial fibrillation, chronic   1. Acute on chronic respiratory failure with hypoxia we will continue with T collar wean titrate oxygen patient is at baseline I do not see that she is going to be decannulated at this time. 2. Severe sepsis right now hemodynamically stable 3. Rheumatoid arthritis at baseline 4. Chronic atrial fibrillation rate is controlled 5. Steroid dependence we will continue with present management 6. COPD severe end-stage disease   I have personally seen and evaluated the patient, evaluated laboratory and imaging results, formulated the assessment and plan and placed orders. The Patient requires high complexity decision making for assessment and support.  Case was discussed on Rounds with the Respiratory Therapy Staff  Yevonne Pax, MD [facility] Pulmonary  Critical Care Medicine Sleep Medicine

## 2018-06-06 DIAGNOSIS — M069 Rheumatoid arthritis, unspecified: Secondary | ICD-10-CM | POA: Diagnosis not present

## 2018-06-06 DIAGNOSIS — J449 Chronic obstructive pulmonary disease, unspecified: Secondary | ICD-10-CM | POA: Diagnosis not present

## 2018-06-06 DIAGNOSIS — J9621 Acute and chronic respiratory failure with hypoxia: Secondary | ICD-10-CM | POA: Diagnosis not present

## 2018-06-06 DIAGNOSIS — I482 Chronic atrial fibrillation, unspecified: Secondary | ICD-10-CM | POA: Diagnosis not present

## 2018-06-06 LAB — BASIC METABOLIC PANEL
Anion gap: 8 (ref 5–15)
BUN: 18 mg/dL (ref 8–23)
CO2: 32 mmol/L (ref 22–32)
Calcium: 8.9 mg/dL (ref 8.9–10.3)
Chloride: 100 mmol/L (ref 98–111)
Creatinine, Ser: 0.38 mg/dL — ABNORMAL LOW (ref 0.44–1.00)
GFR calc Af Amer: >60 mL/min (ref >60)
GFR calc non Af Amer: >60 mL/min (ref >60)
Glucose, Bld: 228 mg/dL — ABNORMAL HIGH (ref 70–99)
Potassium: 4 mmol/L (ref 3.5–5.1)
Sodium: 140 mmol/L (ref 135–145)

## 2018-06-06 LAB — DIGOXIN LEVEL: Digoxin Level: 1.3 ng/mL (ref 0.8–2.0)

## 2018-06-06 NOTE — Progress Notes (Signed)
Pulmonary Critical Care Medicine [facility] [place]   PULMONARY CRITICAL CARE SERVICE  PROGRESS NOTE  Date of Service: 06/06/2018  Brittney Lowe  [id number]:[id number]  DOB: 06/26/1950   DOA: 04/27/2018  Referring Physician: Carron Curie, MD  HPI: Brittney Lowe is a 68 y.o. female seen for follow up of Acute on Chronic Respiratory Failure.  Doing well at this time she had PMV with speaking she has been more awake and alert  Medications: Reviewed on Rounds  Physical Exam:  Vitals: Temperature 98.0 pulse 80 respiratory 29 blood pressure 146/66 saturations 91%  Ventilator Settings currently on T collar FiO2 28%  . General: Comfortable at this time . Eyes: Grossly normal lids, irises & conjunctiva . ENT: grossly tongue is normal . Neck: no obvious mass . Cardiovascular: S1 S2 normal no gallop . Respiratory: No rhonchi or rales . Abdomen: soft . Skin: no rash seen on limited exam . Musculoskeletal: not rigid . Psychiatric:unable to assess . Neurologic: no seizure no involuntary movements         Lab Data:   Basic Metabolic Panel: No results for input(s): NA, K, CL, CO2, GLUCOSE, BUN, CREATININE, CALCIUM, MG, PHOS in the last 168 hours.  ABG: No results for input(s): PHART, PCO2ART, PO2ART, HCO3, O2SAT in the last 168 hours.  Liver Function Tests: No results for input(s): AST, ALT, ALKPHOS, BILITOT, PROT, ALBUMIN in the last 168 hours. No results for input(s): LIPASE, AMYLASE in the last 168 hours. No results for input(s): AMMONIA in the last 168 hours.  CBC: No results for input(s): WBC, NEUTROABS, HGB, HCT, MCV, PLT in the last 168 hours.  Cardiac Enzymes: No results for input(s): CKTOTAL, CKMB, CKMBINDEX, TROPONINI in the last 168 hours.  BNP (last 3 results) No results for input(s): BNP in the last 8760 hours.  ProBNP (last 3 results) No results for input(s): PROBNP in the last 8760 hours.  Radiological Exams: No results  found.  Assessment/Plan Active Problems:   Acute on chronic respiratory failure with hypoxia (HCC)   COPD, severe (HCC)   Rheumatoid arthritis (HCC)   Steroid-dependent chronic obstructive pulmonary disease (HCC)   Atrial fibrillation, chronic   1. Acute on chronic respiratory failure with hypoxia we will continue to advance the wean today working to start capping 2. Severe COPD at baseline continue present management 3. Steroid-dependent COPD at baseline 4. Chronic atrial fibrillation rate controlled   I have personally seen and evaluated the patient, evaluated laboratory and imaging results, formulated the assessment and plan and placed orders. The Patient requires high complexity decision making for assessment and support.  Case was discussed on Rounds with the Respiratory Therapy Staff  Yevonne Pax, MD [facility] Pulmonary Critical Care Medicine Sleep Medicine

## 2018-06-07 DIAGNOSIS — I482 Chronic atrial fibrillation, unspecified: Secondary | ICD-10-CM | POA: Diagnosis not present

## 2018-06-07 DIAGNOSIS — J449 Chronic obstructive pulmonary disease, unspecified: Secondary | ICD-10-CM | POA: Diagnosis not present

## 2018-06-07 DIAGNOSIS — M069 Rheumatoid arthritis, unspecified: Secondary | ICD-10-CM | POA: Diagnosis not present

## 2018-06-07 DIAGNOSIS — J9621 Acute and chronic respiratory failure with hypoxia: Secondary | ICD-10-CM | POA: Diagnosis not present

## 2018-06-07 NOTE — Progress Notes (Signed)
Pulmonary Critical Care Medicine [facility] [place]   PULMONARY CRITICAL CARE SERVICE  PROGRESS NOTE  Date of Service: 06/07/2018  Brittney Lowe  DYN:[id number]  DOB: 06-Apr-1951   DOA: 04/27/2018  Referring Physician: Carron Curie, MD  HPI: Brittney Lowe is a 68 y.o. female seen for follow up of Acute on Chronic Respiratory Failure.  Currently is capping without distress patient is on 2 L oxygen  Medications: Reviewed on Rounds  Physical Exam:  Vitals: Temperature 95.8 pulse 88 respiratory 28 blood pressure 131/76 saturations 100%  Ventilator Settings capping off the ventilator  . General: Comfortable at this time . Eyes: Grossly normal lids, irises & conjunctiva . ENT: grossly tongue is normal . Neck: no obvious mass . Cardiovascular: S1 S2 normal no gallop . Respiratory: No rhonchi or rales are noted at this time . Abdomen: soft . Skin: no rash seen on limited exam . Musculoskeletal: not rigid . Psychiatric:unable to assess . Neurologic: no seizure no involuntary movements         Lab Data:   Basic Metabolic Panel: Recent Labs  Lab 06/06/18 1505  NA 140  K 4.0  CL 100  CO2 32  GLUCOSE 228*  BUN 18  CREATININE 0.38*  CALCIUM 8.9    ABG: No results for input(s): PHART, PCO2ART, PO2ART, HCO3, O2SAT in the last 168 hours.  Liver Function Tests: No results for input(s): AST, ALT, ALKPHOS, BILITOT, PROT, ALBUMIN in the last 168 hours. No results for input(s): LIPASE, AMYLASE in the last 168 hours. No results for input(s): AMMONIA in the last 168 hours.  CBC: No results for input(s): WBC, NEUTROABS, HGB, HCT, MCV, PLT in the last 168 hours.  Cardiac Enzymes: No results for input(s): CKTOTAL, CKMB, CKMBINDEX, TROPONINI in the last 168 hours.  BNP (last 3 results) No results for input(s): BNP in the last 8760 hours.  ProBNP (last 3 results) No results for input(s): PROBNP in the last 8760 hours.  Radiological Exams: No results  found.  Assessment/Plan Active Problems:   Acute on chronic respiratory failure with hypoxia (HCC)   COPD, severe (HCC)   Rheumatoid arthritis (HCC)   Steroid-dependent chronic obstructive pulmonary disease (HCC)   Atrial fibrillation, chronic   1. Acute on chronic respiratory failure with hypoxia we will continue with capping trials continue secretion management pulmonary toilet 2. Severe COPD at baseline 3. Steroid dependence tapering 4. Chronic atrial fibrillation rate controlled   I have personally seen and evaluated the patient, evaluated laboratory and imaging results, formulated the assessment and plan and placed orders. The Patient requires high complexity decision making for assessment and support.  Case was discussed on Rounds with the Respiratory Therapy Staff  Yevonne Pax, MD [facility] Pulmonary Critical Care Medicine Sleep Medicine

## 2018-06-08 DIAGNOSIS — I482 Chronic atrial fibrillation, unspecified: Secondary | ICD-10-CM | POA: Diagnosis not present

## 2018-06-08 DIAGNOSIS — J449 Chronic obstructive pulmonary disease, unspecified: Secondary | ICD-10-CM | POA: Diagnosis not present

## 2018-06-08 DIAGNOSIS — J9621 Acute and chronic respiratory failure with hypoxia: Secondary | ICD-10-CM | POA: Diagnosis not present

## 2018-06-08 DIAGNOSIS — M069 Rheumatoid arthritis, unspecified: Secondary | ICD-10-CM | POA: Diagnosis not present

## 2018-06-08 NOTE — Progress Notes (Addendum)
Pulmonary Critical Care Medicine [facility] [place]   PULMONARY CRITICAL CARE SERVICE  PROGRESS NOTE  Date of Service: 06/08/2018  Brittney Lowe  QAS:[id number]  DOB: [birth year]   DOA: 04/27/2018  Referring Physician: Carron Curie, MD  HPI: Brittney Lowe is a 68 y.o. female seen for follow up of Acute on Chronic Respiratory Failure.  Patient continues to do well now is 48 hours capped and doing well on 2 L of oxygen via nasal cannula.  Medications: Reviewed on Rounds  Physical Exam:  Vitals: Pulse 78 respirations 20 BP 120/65 O2 sat 99% temp 98.4  Ventilator Settings patient's not currently on ventilator  . General: Comfortable at this time . Eyes: Grossly normal lids, irises & conjunctiva . ENT: grossly tongue is normal . Neck: no obvious mass . Cardiovascular: S1 S2 normal no gallop . Respiratory: No rales or rhonchi noted . Abdomen: soft . Skin: no rash seen on limited exam . Musculoskeletal: not rigid . Psychiatric:unable to assess . Neurologic: no seizure no involuntary movements         Lab Data:   Basic Metabolic Panel: Recent Labs  Lab 06/06/18 1505  NA 140  K 4.0  CL 100  CO2 32  GLUCOSE 228*  BUN 18  CREATININE 0.38*  CALCIUM 8.9    ABG: No results for input(s): PHART, PCO2ART, PO2ART, HCO3, O2SAT in the last 168 hours.  Liver Function Tests: No results for input(s): AST, ALT, ALKPHOS, BILITOT, PROT, ALBUMIN in the last 168 hours. No results for input(s): LIPASE, AMYLASE in the last 168 hours. No results for input(s): AMMONIA in the last 168 hours.  CBC: No results for input(s): WBC, NEUTROABS, HGB, HCT, MCV, PLT in the last 168 hours.  Cardiac Enzymes: No results for input(s): CKTOTAL, CKMB, CKMBINDEX, TROPONINI in the last 168 hours.  BNP (last 3 results) No results for input(s): BNP in the last 8760 hours.  ProBNP (last 3 results) No results for input(s): PROBNP in the last 8760 hours.  Radiological Exams: No  results found.  Assessment/Plan Active Problems:   Acute on chronic respiratory failure with hypoxia (HCC)   COPD, severe (HCC)   Rheumatoid arthritis (HCC)   Steroid-dependent chronic obstructive pulmonary disease (HCC)   Atrial fibrillation, chronic   1. Acute on chronic respiratory failure with hypoxia continue with capping trials as well as secretion management and pulmonary toilet. 2. Severe COPD at baseline 3. Steroid dependence tapering 4. Chronic atrial fibrillation rate controlled   I have personally seen and evaluated the patient, evaluated laboratory and imaging results, formulated the assessment and plan and placed orders. The Patient requires high complexity decision making for assessment and support.  Case was discussed on Rounds with the Respiratory Therapy Staff  Yevonne Pax, MD [facility] Pulmonary Critical Care Medicine Sleep Medicine

## 2018-06-09 DIAGNOSIS — M069 Rheumatoid arthritis, unspecified: Secondary | ICD-10-CM | POA: Diagnosis not present

## 2018-06-09 DIAGNOSIS — I482 Chronic atrial fibrillation, unspecified: Secondary | ICD-10-CM | POA: Diagnosis not present

## 2018-06-09 DIAGNOSIS — J9621 Acute and chronic respiratory failure with hypoxia: Secondary | ICD-10-CM | POA: Diagnosis not present

## 2018-06-09 DIAGNOSIS — J449 Chronic obstructive pulmonary disease, unspecified: Secondary | ICD-10-CM | POA: Diagnosis not present

## 2018-06-09 NOTE — Progress Notes (Addendum)
Pulmonary Critical Care Medicine [facility] [place]   PULMONARY CRITICAL CARE SERVICE  PROGRESS NOTE  Date of Service: 06/09/2018  Brittney Lowe  PYY:[id number]  DOB: 1950/08/27   DOA: 04/27/2018  Referring Physician: Carron Curie, MD  HPI: Brittney Lowe is a 68 y.o. female seen for follow up of Acute on Chronic Respiratory Failure.  Patient has now been capped for 72 hours.  Remains on 2 L of oxygen via nasal cannula.  Patient is awaiting pacemaker and should not be decannulated at this time.  Medications: Reviewed on Rounds  Physical Exam:  Vitals: Pulse 82 respirations 22 BP 120/64 O2 sat 98% temp 97.7  Ventilator Settings patient's not currently on ventilator  . General: Comfortable at this time . Eyes: Grossly normal lids, irises & conjunctiva . ENT: grossly tongue is normal . Neck: no obvious mass . Cardiovascular: S1 S2 normal no gallop . Respiratory: No rales or rhonchi noted . Abdomen: soft . Skin: no rash seen on limited exam . Musculoskeletal: not rigid . Psychiatric:unable to assess . Neurologic: no seizure no involuntary movements         Lab Data:   Basic Metabolic Panel: Recent Labs  Lab 06/06/18 1505  NA 140  K 4.0  CL 100  CO2 32  GLUCOSE 228*  BUN 18  CREATININE 0.38*  CALCIUM 8.9    ABG: No results for input(s): PHART, PCO2ART, PO2ART, HCO3, O2SAT in the last 168 hours.  Liver Function Tests: No results for input(s): AST, ALT, ALKPHOS, BILITOT, PROT, ALBUMIN in the last 168 hours. No results for input(s): LIPASE, AMYLASE in the last 168 hours. No results for input(s): AMMONIA in the last 168 hours.  CBC: No results for input(s): WBC, NEUTROABS, HGB, HCT, MCV, PLT in the last 168 hours.  Cardiac Enzymes: No results for input(s): CKTOTAL, CKMB, CKMBINDEX, TROPONINI in the last 168 hours.  BNP (last 3 results) No results for input(s): BNP in the last 8760 hours.  ProBNP (last 3 results) No results for input(s):  PROBNP in the last 8760 hours.  Radiological Exams: No results found.  Assessment/Plan Active Problems:   Acute on chronic respiratory failure with hypoxia (HCC)   COPD, severe (HCC)   Rheumatoid arthritis (HCC)   Steroid-dependent chronic obstructive pulmonary disease (HCC)   Atrial fibrillation, chronic   1. Acute on chronic respiratory failure with hypoxia continue with capping trials as well as secretion management pulmonary toilet.  Patient should not be decannulated at this time. 2. Severe COPD at baseline 3. Steroid dependence tapering 4. Chronic atrial fibrillation rate controlled   I have personally seen and evaluated the patient, evaluated laboratory and imaging results, formulated the assessment and plan and placed orders. The Patient requires high complexity decision making for assessment and support.  Case was discussed on Rounds with the Respiratory Therapy Staff  Yevonne Pax, MD [facility] Pulmonary Critical Care Medicine Sleep Medicine

## 2018-06-10 DIAGNOSIS — J449 Chronic obstructive pulmonary disease, unspecified: Secondary | ICD-10-CM | POA: Diagnosis not present

## 2018-06-10 DIAGNOSIS — J9621 Acute and chronic respiratory failure with hypoxia: Secondary | ICD-10-CM | POA: Diagnosis not present

## 2018-06-10 DIAGNOSIS — I482 Chronic atrial fibrillation, unspecified: Secondary | ICD-10-CM | POA: Diagnosis not present

## 2018-06-10 DIAGNOSIS — M069 Rheumatoid arthritis, unspecified: Secondary | ICD-10-CM | POA: Diagnosis not present

## 2018-06-10 NOTE — Consult Note (Signed)
Ref: No primary care provider on file.   Subjective:  Tachy-brady/SVT/Paroxysmal atrial fibrillation.   Objective:  Vital Signs in the last 24 hours: See Chart.    Physical Exam: BP Readings from Last 1 Encounters:  No data found for BP     Wt Readings from Last 1 Encounters:  No data found for Wt    Weight change:  There is no height or weight on file to calculate BMI. HEENT: Brittney Lowe/AT, Eyes-Blue, Conjunctiva-Pale, Sclera-Non-icteric Neck: No JVD, No bruit, Trachea midline. Tracheostomy capped off.  Lungs:  Clearing, Bilateral. Cardiac:  Regular rhythm, normal S1 and S2, no S3. II/VI systolic murmur. Abdomen:  Soft, non-tender. BS present. Extremities:  No edema present. No cyanosis. No clubbing. CNS: AxOx3, Cranial nerves grossly intact, moves all 4 extremities.  Skin: Warm and dry.   Intake/Output from previous day: No intake/output data recorded.    Lab Results: BMET    Component Value Date/Time   NA 140 06/06/2018 1505   NA 142 05/30/2018 0612   NA 142 05/28/2018 0521   K 4.0 06/06/2018 1505   K 3.8 05/30/2018 0612   K 3.0 (L) 05/28/2018 0521   CL 100 06/06/2018 1505   CL 100 05/30/2018 0612   CL 101 05/28/2018 0521   CO2 32 06/06/2018 1505   CO2 32 05/30/2018 0612   CO2 34 (H) 05/28/2018 0521   GLUCOSE 228 (H) 06/06/2018 1505   GLUCOSE 116 (H) 05/30/2018 0612   GLUCOSE 133 (H) 05/28/2018 0521   BUN 18 06/06/2018 1505   BUN 8 05/30/2018 0612   BUN 10 05/28/2018 0521   CREATININE 0.38 (L) 06/06/2018 1505   CREATININE <0.30 (L) 05/30/2018 0612   CREATININE <0.30 (L) 05/28/2018 0521   CALCIUM 8.9 06/06/2018 1505   CALCIUM 9.3 05/30/2018 0612   CALCIUM 8.7 (L) 05/28/2018 0521   GFRNONAA >60 06/06/2018 1505   GFRNONAA NOT CALCULATED 05/30/2018 0612   GFRNONAA NOT CALCULATED 05/28/2018 0521   GFRAA >60 06/06/2018 1505   GFRAA NOT CALCULATED 05/30/2018 0612   GFRAA NOT CALCULATED 05/28/2018 0521   CBC    Component Value Date/Time   WBC 9.8 05/28/2018  0521   RBC 3.02 (L) 05/28/2018 0521   HGB 7.5 (L) 05/28/2018 0521   HCT 26.9 (L) 05/28/2018 0521   PLT 575 (H) 05/28/2018 0521   MCV 89.1 05/28/2018 0521   MCH 24.8 (L) 05/28/2018 0521   MCHC 27.9 (L) 05/28/2018 0521   RDW 16.7 (H) 05/28/2018 0521   LYMPHSABS 3.2 04/28/2018 0514   MONOABS 1.0 04/28/2018 0514   EOSABS 0.0 04/28/2018 0514   BASOSABS 0.2 (H) 04/28/2018 0514   HEPATIC Function Panel Recent Labs    04/28/18 0514 05/12/18 0530  PROT 5.9* 4.8*   HEMOGLOBIN A1C No components found for: HGA1C,  MPG CARDIAC ENZYMES Lab Results  Component Value Date   CKTOTAL 14 (L) 05/18/2018   CKMB 1.4 05/18/2018   TROPONINI <0.03 05/18/2018   BNP No results for input(s): PROBNP in the last 8760 hours. TSH Recent Labs    04/28/18 0514  TSH 2.521   CHOLESTEROL No results for input(s): CHOL in the last 8760 hours.  Scheduled Meds: Continuous Infusions: PRN Meds:.  Assessment/Plan: Paroxysmal atrial fibrillation. Sinus bradycardia, asymptomatic Acute on chronic respiratory failure Severe COPD Rheumatoid arthritis   Start amiodarone to maintain sinus rhythm. Will discontinue diltiazem and metoprolol as needed. Asymptomatic sinus bradycardia with metoprolol use or when resting.    LOS: 0 days    Orpah Cobb  MD  06/10/2018, 5:36 PM

## 2018-06-10 NOTE — Progress Notes (Signed)
Pulmonary Critical Care Medicine [facility] [place]   PULMONARY CRITICAL CARE SERVICE  PROGRESS NOTE  Date of Service: 06/10/2018  Brittney Lowe  [id number]  DOB: 06-18-1950   DOA: 04/27/2018  Referring Physician: Carron Curie, MD  HPI: Brittney Lowe is a 68 y.o. female seen for follow up of Acute on Chronic Respiratory Failure.  Capping right now patient is on 2 L awaiting word from cardiology regarding her cardiac issues apparently had some tachybradycardia  Medications: Reviewed on Rounds  Physical Exam:  Vitals: Temperature 98.3 pulse 77 respiratory 23 blood pressure 141/59 saturations 98%  Ventilator Settings capping off the ventilator awaiting to be decannulated  . General: Comfortable at this time . Eyes: Grossly normal lids, irises & conjunctiva . ENT: grossly tongue is normal . Neck: no obvious mass . Cardiovascular: S1 S2 normal no gallop . Respiratory: Coarse breath sounds no rhonchi are noted . Abdomen: soft . Skin: no rash seen on limited exam . Musculoskeletal: not rigid . Psychiatric:unable to assess . Neurologic: no seizure no involuntary movements         Lab Data:   Basic Metabolic Panel: Recent Labs  Lab 06/06/18 1505  NA 140  K 4.0  CL 100  CO2 32  GLUCOSE 228*  BUN 18  CREATININE 0.38*  CALCIUM 8.9    ABG: No results for input(s): PHART, PCO2ART, PO2ART, HCO3, O2SAT in the last 168 hours.  Liver Function Tests: No results for input(s): AST, ALT, ALKPHOS, BILITOT, PROT, ALBUMIN in the last 168 hours. No results for input(s): LIPASE, AMYLASE in the last 168 hours. No results for input(s): AMMONIA in the last 168 hours.  CBC: No results for input(s): WBC, NEUTROABS, HGB, HCT, MCV, PLT in the last 168 hours.  Cardiac Enzymes: No results for input(s): CKTOTAL, CKMB, CKMBINDEX, TROPONINI in the last 168 hours.  BNP (last 3 results) No results for input(s): BNP in the last 8760 hours.  ProBNP (last 3  results) No results for input(s): PROBNP in the last 8760 hours.  Radiological Exams: No results found.  Assessment/Plan Active Problems:   Acute on chronic respiratory failure with hypoxia (HCC)   COPD, severe (HCC)   Rheumatoid arthritis (HCC)   Steroid-dependent chronic obstructive pulmonary disease (HCC)   Atrial fibrillation, chronic   1. Acute on chronic respiratory failure hypoxia we will continue with capping for now await cardiology input 2. Severe COPD should be able to be decannulated on 2 L right now 3. Steroid dependence continue with supportive care 4. Cardiac atrial fibrillation rate controlled 5. Rheumatoid arthritis controlled   I have personally seen and evaluated the patient, evaluated laboratory and imaging results, formulated the assessment and plan and placed orders. The Patient requires high complexity decision making for assessment and support.  Case was discussed on Rounds with the Respiratory Therapy Staff  Yevonne Pax, MD [facility] Pulmonary Critical Care Medicine Sleep Medicine

## 2018-06-11 ENCOUNTER — Other Ambulatory Visit (HOSPITAL_COMMUNITY): Payer: Medicare HMO

## 2018-06-11 DIAGNOSIS — I482 Chronic atrial fibrillation, unspecified: Secondary | ICD-10-CM | POA: Diagnosis not present

## 2018-06-11 DIAGNOSIS — M069 Rheumatoid arthritis, unspecified: Secondary | ICD-10-CM | POA: Diagnosis not present

## 2018-06-11 DIAGNOSIS — J449 Chronic obstructive pulmonary disease, unspecified: Secondary | ICD-10-CM | POA: Diagnosis not present

## 2018-06-11 DIAGNOSIS — J9621 Acute and chronic respiratory failure with hypoxia: Secondary | ICD-10-CM | POA: Diagnosis not present

## 2018-06-11 LAB — BASIC METABOLIC PANEL
Anion gap: 9 (ref 5–15)
BUN: 25 mg/dL — ABNORMAL HIGH (ref 8–23)
CO2: 35 mmol/L — ABNORMAL HIGH (ref 22–32)
Calcium: 9.1 mg/dL (ref 8.9–10.3)
Chloride: 100 mmol/L (ref 98–111)
Creatinine, Ser: 0.35 mg/dL — ABNORMAL LOW (ref 0.44–1.00)
GFR calc Af Amer: >60 mL/min (ref >60)
GFR calc non Af Amer: >60 mL/min (ref >60)
GLUCOSE: 125 mg/dL — AB (ref 70–99)
Potassium: 2.9 mmol/L — ABNORMAL LOW (ref 3.5–5.1)
Sodium: 144 mmol/L (ref 135–145)

## 2018-06-11 LAB — CBC
HEMATOCRIT: 23.9 % — AB (ref 36.0–46.0)
Hemoglobin: 6.6 g/dL — CL (ref 12.0–15.0)
MCH: 23.1 pg — ABNORMAL LOW (ref 26.0–34.0)
MCHC: 27.6 g/dL — ABNORMAL LOW (ref 30.0–36.0)
MCV: 83.6 fL (ref 80.0–100.0)
Platelets: 1057 10*3/uL (ref 150–400)
RBC: 2.86 MIL/uL — ABNORMAL LOW (ref 3.87–5.11)
RDW: 17.7 % — ABNORMAL HIGH (ref 11.5–15.5)
WBC: 19.7 10*3/uL — ABNORMAL HIGH (ref 4.0–10.5)
nRBC: 0.3 % — ABNORMAL HIGH (ref 0.0–0.2)

## 2018-06-11 LAB — PROTIME-INR
INR: 1.2 (ref 0.8–1.2)
Prothrombin Time: 14.6 seconds (ref 11.4–15.2)

## 2018-06-11 LAB — PREPARE RBC (CROSSMATCH)

## 2018-06-11 LAB — ABO/RH: ABO/RH(D): A NEG

## 2018-06-11 NOTE — Progress Notes (Signed)
Pulmonary Critical Care Medicine [facility] [place]   PULMONARY CRITICAL CARE SERVICE  PROGRESS NOTE  Date of Service: 06/11/2018  Brittney Lowe  [id number]:[id number]  DOB: 1950/11/21   DOA: 04/27/2018  Referring Physician: Carron Curie, MD  HPI: Brittney Lowe is a 68 y.o. female seen for follow up of Acute on Chronic Respiratory Failure.  Patient was seen by cardiology no pacemaker recommended at this time instead medications are going to be adjusted  Medications: Reviewed on Rounds  Physical Exam:  Vitals: Temperature 97.7 pulse 77 respiratory 23 blood pressure 101/45 saturations 96%  Ventilator Settings capping off the ventilator  . General: Comfortable at this time . Eyes: Grossly normal lids, irises & conjunctiva . ENT: grossly tongue is normal . Neck: no obvious mass . Cardiovascular: S1 S2 normal no gallop . Respiratory: Scattered rhonchi expansion is equal . Abdomen: soft . Skin: no rash seen on limited exam . Musculoskeletal: not rigid . Psychiatric:unable to assess . Neurologic: no seizure no involuntary movements         Lab Data:   Basic Metabolic Panel: Recent Labs  Lab 06/06/18 1505 06/11/18 0535  NA 140 144  K 4.0 2.9*  CL 100 100  CO2 32 35*  GLUCOSE 228* 125*  BUN 18 25*  CREATININE 0.38* 0.35*  CALCIUM 8.9 9.1    ABG: No results for input(s): PHART, PCO2ART, PO2ART, HCO3, O2SAT in the last 168 hours.  Liver Function Tests: No results for input(s): AST, ALT, ALKPHOS, BILITOT, PROT, ALBUMIN in the last 168 hours. No results for input(s): LIPASE, AMYLASE in the last 168 hours. No results for input(s): AMMONIA in the last 168 hours.  CBC: Recent Labs  Lab 06/11/18 0535  WBC 19.7*  HGB 6.6*  HCT 23.9*  MCV 83.6  PLT 1,057*    Cardiac Enzymes: No results for input(s): CKTOTAL, CKMB, CKMBINDEX, TROPONINI in the last 168 hours.  BNP (last 3 results) No results for input(s): BNP in the last 8760 hours.  ProBNP (last  3 results) No results for input(s): PROBNP in the last 8760 hours.  Radiological Exams: No results found.  Assessment/Plan Active Problems:   Acute on chronic respiratory failure with hypoxia (HCC)   COPD, severe (HCC)   Rheumatoid arthritis (HCC)   Steroid-dependent chronic obstructive pulmonary disease (HCC)   Atrial fibrillation, chronic   1. Acute on chronic respiratory failure with hypoxia we will going to continue to monitor for any further evidence of bradycardia however I think with discontinuing the Cardizem and metoprolol this should help significantly 2. Severe COPD advanced disease continue present management 3. Steroid dependence supportive care 4. Chronic atrial fibrillation rate controlled 5. Rheumatoid arthritis stable   I have personally seen and evaluated the patient, evaluated laboratory and imaging results, formulated the assessment and plan and placed orders. The Patient requires high complexity decision making for assessment and support.  Case was discussed on Rounds with the Respiratory Therapy Staff  Yevonne Pax, MD [facility] Pulmonary Critical Care Medicine Sleep Medicine

## 2018-06-12 ENCOUNTER — Other Ambulatory Visit (HOSPITAL_COMMUNITY): Payer: Medicare HMO

## 2018-06-12 DIAGNOSIS — J9621 Acute and chronic respiratory failure with hypoxia: Secondary | ICD-10-CM | POA: Diagnosis not present

## 2018-06-12 DIAGNOSIS — J449 Chronic obstructive pulmonary disease, unspecified: Secondary | ICD-10-CM | POA: Diagnosis not present

## 2018-06-12 DIAGNOSIS — I482 Chronic atrial fibrillation, unspecified: Secondary | ICD-10-CM | POA: Diagnosis not present

## 2018-06-12 DIAGNOSIS — M069 Rheumatoid arthritis, unspecified: Secondary | ICD-10-CM | POA: Diagnosis not present

## 2018-06-12 LAB — TYPE AND SCREEN
ABO/RH(D): A NEG
Antibody Screen: NEGATIVE
Unit division: 0

## 2018-06-12 LAB — CBC WITH DIFFERENTIAL/PLATELET
Abs Immature Granulocytes: 0.17 10*3/uL — ABNORMAL HIGH (ref 0.00–0.07)
Basophils Absolute: 0.1 10*3/uL (ref 0.0–0.1)
Basophils Relative: 0 %
Eosinophils Absolute: 0.1 10*3/uL (ref 0.0–0.5)
Eosinophils Relative: 1 %
HEMATOCRIT: 30.7 % — AB (ref 36.0–46.0)
HEMOGLOBIN: 9 g/dL — AB (ref 12.0–15.0)
Immature Granulocytes: 1 %
LYMPHS PCT: 22 %
Lymphs Abs: 4.2 10*3/uL — ABNORMAL HIGH (ref 0.7–4.0)
MCH: 24.3 pg — ABNORMAL LOW (ref 26.0–34.0)
MCHC: 29.3 g/dL — ABNORMAL LOW (ref 30.0–36.0)
MCV: 82.7 fL (ref 80.0–100.0)
Monocytes Absolute: 2 10*3/uL — ABNORMAL HIGH (ref 0.1–1.0)
Monocytes Relative: 10 %
NEUTROS ABS: 13 10*3/uL — AB (ref 1.7–7.7)
Neutrophils Relative %: 66 %
Platelets: 936 10*3/uL (ref 150–400)
RBC: 3.71 MIL/uL — ABNORMAL LOW (ref 3.87–5.11)
RDW: 16.9 % — ABNORMAL HIGH (ref 11.5–15.5)
WBC: 19.5 10*3/uL — ABNORMAL HIGH (ref 4.0–10.5)
nRBC: 0.3 % — ABNORMAL HIGH (ref 0.0–0.2)

## 2018-06-12 LAB — COMPREHENSIVE METABOLIC PANEL
ALT: 27 U/L (ref 0–44)
AST: 17 U/L (ref 15–41)
Albumin: 2.8 g/dL — ABNORMAL LOW (ref 3.5–5.0)
Alkaline Phosphatase: 24 U/L — ABNORMAL LOW (ref 38–126)
Anion gap: 11 (ref 5–15)
BUN: 13 mg/dL (ref 8–23)
CO2: 29 mmol/L (ref 22–32)
Calcium: 9.1 mg/dL (ref 8.9–10.3)
Chloride: 98 mmol/L (ref 98–111)
Creatinine, Ser: 0.31 mg/dL — ABNORMAL LOW (ref 0.44–1.00)
GFR calc Af Amer: >60 mL/min (ref >60)
GFR calc non Af Amer: >60 mL/min (ref >60)
Glucose, Bld: 91 mg/dL (ref 70–99)
POTASSIUM: 4.5 mmol/L (ref 3.5–5.1)
SODIUM: 138 mmol/L (ref 135–145)
Total Bilirubin: 0.3 mg/dL (ref 0.3–1.2)
Total Protein: 5.1 g/dL — ABNORMAL LOW (ref 6.5–8.1)

## 2018-06-12 LAB — BPAM RBC
Blood Product Expiration Date: 202003162359
ISSUE DATE / TIME: 202002251347
Unit Type and Rh: 600

## 2018-06-12 LAB — OCCULT BLOOD X 1 CARD TO LAB, STOOL: Fecal Occult Bld: POSITIVE — AB

## 2018-06-12 MED ORDER — IOHEXOL 300 MG/ML  SOLN
80.0000 mL | Freq: Once | INTRAMUSCULAR | Status: AC | PRN
  Administered 2018-06-12: 80 mL via INTRAVENOUS

## 2018-06-12 NOTE — Progress Notes (Addendum)
Pulmonary Critical Care Medicine [facility] [place]   PULMONARY CRITICAL CARE SERVICE  PROGRESS NOTE  Date of Service: 06/12/2018  Brittney Lowe  [id number]  DOB: 13-Feb-1951   DOA: 04/27/2018  Referring Physician: Carron Curie, MD  HPI: Brittney Lowe is a 68 y.o. female seen for follow up of Acute on Chronic Respiratory Failure.  Patient remains capped at this time and is on 2 L of oxygen via nasal cannula.  Patient's lab values have declined and a CT abdomen was ordered for concern of internal bleeding.  That CT was negative.  Medications: Reviewed on Rounds  Physical Exam:  Vitals: Pulse 70 respirations 27 BP 127/71 O2 sat 99% temp 97.6  Ventilator Settings capping off ventilator  . General: Comfortable at this time . Eyes: Grossly normal lids, irises & conjunctiva . ENT: grossly tongue is normal . Neck: no obvious mass . Cardiovascular: S1 S2 normal no gallop . Respiratory: Scattered rhonchi . Abdomen: soft . Skin: no rash seen on limited exam . Musculoskeletal: not rigid . Psychiatric:unable to assess . Neurologic: no seizure no involuntary movements         Lab Data:   Basic Metabolic Panel: Recent Labs  Lab 06/06/18 1505 06/11/18 0535 06/12/18 0552  NA 140 144 138  K 4.0 2.9* 4.5  CL 100 100 98  CO2 32 35* 29  GLUCOSE 228* 125* 91  BUN 18 25* 13  CREATININE 0.38* 0.35* 0.31*  CALCIUM 8.9 9.1 9.1    ABG: No results for input(s): PHART, PCO2ART, PO2ART, HCO3, O2SAT in the last 168 hours.  Liver Function Tests: Recent Labs  Lab 06/12/18 0552  AST 17  ALT 27  ALKPHOS 24*  BILITOT 0.3  PROT 5.1*  ALBUMIN 2.8*   No results for input(s): LIPASE, AMYLASE in the last 168 hours. No results for input(s): AMMONIA in the last 168 hours.  CBC: Recent Labs  Lab 06/11/18 0535 06/12/18 0552  WBC 19.7* 19.5*  NEUTROABS  --  13.0*  HGB 6.6* 9.0*  HCT 23.9* 30.7*  MCV 83.6 82.7  PLT 1,057* 936*    Cardiac Enzymes: No  results for input(s): CKTOTAL, CKMB, CKMBINDEX, TROPONINI in the last 168 hours.  BNP (last 3 results) No results for input(s): BNP in the last 8760 hours.  ProBNP (last 3 results) No results for input(s): PROBNP in the last 8760 hours.  Radiological Exams: Ct Abdomen Pelvis W Contrast  Result Date: 06/12/2018 CLINICAL DATA:  Abdominal pain and decreased hemoglobin EXAM: CT ABDOMEN AND PELVIS WITH CONTRAST TECHNIQUE: Multidetector CT imaging of the abdomen and pelvis was performed using the standard protocol following bolus administration of intravenous contrast. CONTRAST:  25mL OMNIPAQUE IOHEXOL 300 MG/ML  SOLN COMPARISON:  06/11/2018 FINDINGS: Lower chest: Lung bases demonstrate mild bibasilar atelectasis posteriorly not well appreciated on prior plain film examination. Hepatobiliary: No focal liver abnormality is seen. Status post cholecystectomy. Mild fullness of the common bile duct is noted consistent with the post cholecystectomy state. Pancreas: Unremarkable. No pancreatic ductal dilatation or surrounding inflammatory changes. Spleen: Normal in size without focal abnormality. Adrenals/Urinary Tract: Adrenal glands are within normal limits. Kidneys are well visualized bilaterally with scattered renal cystic change. No renal calculi or obstructive changes are noted. The bladder is partially distended. Stomach/Bowel: Gastrostomy catheter is noted in place. Stomach is decompressed. A prominent duodenal diverticulum is noted adjacent to the head of the pancreas. The appendix is not well seen. No inflammatory changes to suggest appendicitis are noted. Mild diverticular change  of the colon is noted without evidence of diverticulitis. The small bowel is otherwise within normal limits. Vascular/Lymphatic: Aortic atherosclerosis. No enlarged abdominal or pelvic lymph nodes. Reproductive: Status post hysterectomy. No adnexal masses. Other: No abdominal wall hernia or abnormality. No abdominopelvic ascites.  Musculoskeletal: Degenerative changes of the lumbar spine are seen. No compression deformities are noted. IMPRESSION: Mild bibasilar atelectatic changes. Renal cystic change.  No obstruction is noted. Diverticulosis without evidence of diverticulitis. Gastrostomy in place. No acute abnormality noted. Electronically Signed   By: Alcide Clever M.D.   On: 06/12/2018 07:54   Dg Chest Port 1 View  Result Date: 06/11/2018 CLINICAL DATA:  Chest tightness EXAM: PORTABLE CHEST 1 VIEW COMPARISON:  06/04/2018 FINDINGS: Unchanged position of tracheostomy tube. Heart size is normal. There is calcific aortic atherosclerosis. Mild diffuse interstitial coarsening. No pleural effusion or pneumothorax. No focal consolidation pulmonary edema. IMPRESSION: Unchanged position of tracheostomy tube.  No focal airspace disease. Electronically Signed   By: Deatra Robinson M.D.   On: 06/11/2018 16:19   Dg Abd Portable 1v  Result Date: 06/11/2018 CLINICAL DATA:  Abdominal distension EXAM: PORTABLE ABDOMEN - 1 VIEW COMPARISON:  05/25/2018 FINDINGS: Unchanged position of percutaneous gastrostomy tube. There is a prominent loop of bowel in the right lower quadrant, but no dilated small bowel visualized. Visceral shadows are normal. No abnormal calcifications. Mild bilateral hip osteoarthrosis. IMPRESSION: Nonobstructive bowel gas pattern. Electronically Signed   By: Deatra Robinson M.D.   On: 06/11/2018 16:17    Assessment/Plan Active Problems:   Acute on chronic respiratory failure with hypoxia (HCC)   COPD, severe (HCC)   Rheumatoid arthritis (HCC)   Steroid-dependent chronic obstructive pulmonary disease (HCC)   Atrial fibrillation, chronic   1. Acute on chronic respiratory failure with hypoxia continue to monitor patient for further issues with bradycardia. 2. Severe COPD advanced disease continue present management 3. Steroid dependence continue supportive care 4. Chronic atrial fibrillation rate controlled 5. Rheumatoid  arthritis stable   I have personally seen and evaluated the patient, evaluated laboratory and imaging results, formulated the assessment and plan and placed orders. The Patient requires high complexity decision making for assessment and support.  Case was discussed on Rounds with the Respiratory Therapy Staff  Yevonne Pax, MD [facility] Pulmonary Critical Care Medicine Sleep Medicine

## 2018-06-13 DIAGNOSIS — I482 Chronic atrial fibrillation, unspecified: Secondary | ICD-10-CM | POA: Diagnosis not present

## 2018-06-13 DIAGNOSIS — J449 Chronic obstructive pulmonary disease, unspecified: Secondary | ICD-10-CM | POA: Diagnosis not present

## 2018-06-13 DIAGNOSIS — J9621 Acute and chronic respiratory failure with hypoxia: Secondary | ICD-10-CM | POA: Diagnosis not present

## 2018-06-13 DIAGNOSIS — M069 Rheumatoid arthritis, unspecified: Secondary | ICD-10-CM | POA: Diagnosis not present

## 2018-06-13 DIAGNOSIS — R195 Other fecal abnormalities: Secondary | ICD-10-CM

## 2018-06-13 DIAGNOSIS — D649 Anemia, unspecified: Secondary | ICD-10-CM

## 2018-06-13 LAB — CBC
HCT: 29.3 % — ABNORMAL LOW (ref 36.0–46.0)
Hemoglobin: 8.6 g/dL — ABNORMAL LOW (ref 12.0–15.0)
MCH: 24.4 pg — ABNORMAL LOW (ref 26.0–34.0)
MCHC: 29.4 g/dL — AB (ref 30.0–36.0)
MCV: 83 fL (ref 80.0–100.0)
Platelets: 959 10*3/uL (ref 150–400)
RBC: 3.53 MIL/uL — ABNORMAL LOW (ref 3.87–5.11)
RDW: 17.2 % — ABNORMAL HIGH (ref 11.5–15.5)
WBC: 20.7 10*3/uL — AB (ref 4.0–10.5)
nRBC: 0.2 % (ref 0.0–0.2)

## 2018-06-13 NOTE — Consult Note (Signed)
[facility] Consult: 2:58 PM 06/13/2018  LOS: 0 days    Referring Provider: Dr Manson Passey  Primary Care Physician:  Dr Doylene Canning in [place] Primary Gastroenterologist:  Gentry Fitz.       Reason for Consultation: Anemia.  Dark stools.   HPI: Brittney Lowe is a 68 y.o. female.  [facility].  She was hospitalized prior to this in [place] for close to 2 months. History CAD.  MI in 2000.  Chronic A. fib.  Rheumatoid arthritis.  Severe, steroid-dependent COPD.  GERD.  Nonbleeding peptic ulcer disease 30 years ago.  Anxiety hypertension. Previous surgeries include cholecystectomy.  Cardiac cath 2000.  Partial hysterectomy.  Spine surgery. 05/2015 colonoscopy by a Dr. Karna Christmas.   Results unknown. Patient was hospitalized with pneumonia and developed respiratory failure requiring intubation.  She ultimately ended up undergoing tracheostomy placement for vent dependence.  She had dysphagia and a feeding gastrostomy tube was placed.   Since transfer she has weaned off the vent.  Her trach is still in place.  Still receiving tube feedings.  She complains of some bloating when meds are administered through the G-tube.  Also had issues with A. fib but as of the present time, cardiologist Dr. Algie Coffer is not recommending pacemaker; instead preferring medical management. Last year she received blood transfusion for anemia.  The plan was for her to follow-up with her GI physician but this never happened.  She was also supposed to follow-up with cardiology for her atrial fibrillation. For the past 5 or 6 weeks her husband has seen dark/black stools consistently.  These are formed and not excessively frequent.  Patient not having any belly pain or nausea.   She does not use NSAIDs.  She drinks a glass of wine maybe a couple of times a year.  No history of liver disease that she knows of.  No history of bleeding disorders. This past week she has had acute on chronic anemia. IMA globin 9.1 on 04/28/2018.  Within 10 days it had gone to 8.5  >> 2 days later it was 7.4 (05/20/2018).  06/11/2018 it was 6.6.  She was transfused and hemoglobins today is 8.6.  MCV 83.  WBCs 20.7.  Platelets elevated, currently 959 but up as high as 5 7 during this admission. Stool was hemoccult positive on 2/26 and at that point she was switched from oral Protonix to IV Protonix. CT abdomen pelvis with contrast 06/12/2018 for complaint of abdominal pain and anemia.  Showed mild bibasilar atelectasis, renal cystic changes.  Diverticulosis in the colon.  G-tube in place.  No acute abnormality.   Allergies include moxifloxacin hives.  Adhesive tape causes swelling.  Amoxicillin/clavulanate vomiting abdominal pain.  Clarithromycin nausea vomiting.  Codeine nausea vomiting.  Latex rash and other.  Spiriva sore throat and throat blisters.  Sulfa unknown and nausea vomiting reactions.  Sulfamethoxazole trimethoprim nausea vomiting and unknown.  Tiotropium and and throat.     Past Medical History:  Diagnosis Date  . Acute  on chronic respiratory failure with hypoxia (HCC)   . Atrial fibrillation, chronic   . COPD, severe (HCC)   . Rheumatoid arthritis (HCC)   . Steroid-dependent chronic obstructive pulmonary disease (HCC)       Prior to Admission medications   Not on File    Scheduled Meds: Protonix 40 mg IV twice daily. Sliding scale insulin. Amiodarone 81 mg aspirin Budesonide inhaled Clonazepam Fish oil Ipratropium/albuterol inhaler Lidoderm patch. Loratadine Metoprolol Modafanil Montelukast inhaler.  Prednisone 40 mg daily Pro-stat protein supplement Tube feedings.  Infusions:  PRN Meds:    Allergies as of 04/27/2018  . (Not on File)    Family history  COPD.  Heart disease.  Lung cancer.  Social History   Socioeconomic History  . Marital status: Not on file    Spouse name: Not on file  . Number of children: Not on file  . Years of education: Not on file  . Highest education level: Not on file  Occupational History  . Not on file  Social Needs  . Financial resource strain: Not on file  . Food insecurity:    Worry: Not on file    Inability: Not on file  . Transportation needs:    Medical: Not on file    Non-medical: Not on file  Tobacco Use  . Smoking status: Not on file  Substance and Sexual Activity  . Alcohol use: Not on file  . Drug use: Not on file  . Sexual activity: Not on file  Lifestyle  . Physical activity:    Days per week: Not on file    Minutes per session: Not on file  . Stress: Not on file  Relationships  . Social connections:    Talks on phone: Not on file    Gets together: Not on file    Attends religious service: Not on file    Active member of club or organization: Not on file    Attends meetings of clubs or organizations: Not on file    Relationship status: Not on file  . Intimate partner violence:    Fear of current or ex partner: Not on file    Emotionally abused: Not on file    Physically abused: Not on file    Forced sexual activity: Not on file  Other Topics Concern  . Not on file  Social History Narrative  . Not on file    REVIEW OF SYSTEMS: Constitutional: No new weakness.  Patient is pretty debilitated having been in the hospital for a months ENT:  No nose bleeds Pulm: Breathing okay with a trach in place. CV:  No palpitations, no LE edema.  No chest pain GU:  No hematuria, no frequency GI: Per HPI. Heme: Patient denies unusual bleeding or bruising. Transfusions: Received transfusion of 1 U PRBC earlier this week. Neuro:  No headaches, no peripheral tingling or numbness Derm:  No itching, no rash or sores.  Endocrine:  No sweats or chills.  No polyuria or dysuria Immunization:  Not queried. Travel:  None beyond local counties in last few months.    PHYSICAL EXAM: Temperature 98.7.  Heart rate 70   BP 114/54.   Saturation 98%.   Durations 25. General: Chronically ill looking, frail, pale WF who is alert and comfortable. Head: No facial asymmetry or swelling.  No signs of head trauma. Eyes: No scleral icterus.  No conjunctival pallor.  EOMI. Ears: Not hard of hearing Nose: No discharge or congestion. Mouth: Slightly dry oral mucosa.  Mucosa is pink and clear.  Tongue midline. Neck: No JVD, no masses, no thyromegaly.  Achy ostomy apparatus in place.  No discharge from the trach site. Lungs: Clear with reduced breath sounds throughout.  No labored breathing. Heart: RRR.  No MRG.  S1, S2 present Abdomen: Soft.  G-tube site benign in mid abdomen.  No HSM, masses, bruits, hernias.  No tenderness..   Rectal: Not performed. Musc/Skeltl: Joint redness or swelling.  Some arthritic deformity in the hands. Extremities: No CCE. Neurologic: Alert.  Oriented x3.  Moves all 4 limbs, strength not tested.  No tremors. Skin: Suspicious rashes or sores.  No large bruises. Tattoos: None Nodes: No cervical adenopathy. Psych: Cooperative, pleasant, calm  Intake/Output from previous day: No intake/output data recorded. Intake/Output this shift: No intake/output data recorded.  LAB RESULTS: Recent Labs    06/11/18 0535 06/12/18 0552 06/13/18 0508  WBC 19.7* 19.5* 20.7*  HGB 6.6* 9.0* 8.6*  HCT 23.9* 30.7* 29.3*  PLT 1,057* 936* 959*   BMET Lab Results  Component Value Date   NA 138 06/12/2018   NA 144 06/11/2018   NA 140 06/06/2018   K 4.5 06/12/2018   K 2.9 (L) 06/11/2018   K 4.0 06/06/2018   CL 98 06/12/2018   CL 100 06/11/2018   CL 100 06/06/2018   CO2 29 06/12/2018   CO2 35 (H) 06/11/2018   CO2 32 06/06/2018   GLUCOSE 91 06/12/2018   GLUCOSE 125 (H) 06/11/2018   GLUCOSE 228 (H) 06/06/2018   BUN 13 06/12/2018   BUN 25 (H) 06/11/2018   BUN 18 06/06/2018    CREATININE 0.31 (L) 06/12/2018   CREATININE 0.35 (L) 06/11/2018   CREATININE 0.38 (L) 06/06/2018   CALCIUM 9.1 06/12/2018   CALCIUM 9.1 06/11/2018   CALCIUM 8.9 06/06/2018   LFT Recent Labs    06/12/18 0552  PROT 5.1*  ALBUMIN 2.8*  AST 17  ALT 27  ALKPHOS 24*  BILITOT 0.3   PT/INR Lab Results  Component Value Date   INR 1.2 06/11/2018   INR 1.36 05/25/2018   INR 1.77 04/28/2018   Hepatitis Panel No results for input(s): HEPBSAG, HCVAB, HEPAIGM, HEPBIGM in the last 72 hours. C-Diff No components found for: CDIFF Lipase  No results found for: LIPASE  Drugs of Abuse  No results found for: LABOPIA, COCAINSCRNUR, LABBENZ, AMPHETMU, THCU, LABBARB   RADIOLOGY STUDIES: Ct Abdomen Pelvis W Contrast  Result Date: 06/12/2018 CLINICAL DATA:  Abdominal pain and decreased hemoglobin EXAM: CT ABDOMEN AND PELVIS WITH CONTRAST TECHNIQUE: Multidetector CT imaging of the abdomen and pelvis was performed using the standard protocol following bolus administration of intravenous contrast. CONTRAST:  80mL OMNIPAQUE IOHEXOL 300 MG/ML  SOLN COMPARISON:  06/11/2018 FINDINGS: Lower chest: Lung bases demonstrate mild bibasilar atelectasis posteriorly not well appreciated on prior plain film examination. Hepatobiliary: No focal liver abnormality is seen. Status post cholecystectomy. Mild fullness of the common bile duct is noted consistent with the post cholecystectomy state. Pancreas: Unremarkable. No pancreatic ductal dilatation or surrounding inflammatory changes. Spleen: Normal in size without focal abnormality. Adrenals/Urinary Tract: Adrenal glands are within normal limits. Kidneys are well visualized bilaterally with scattered renal cystic change. No renal calculi or obstructive changes are noted. The bladder is partially distended. Stomach/Bowel: Gastrostomy catheter is noted in place. Stomach is decompressed. A prominent duodenal diverticulum is noted adjacent to the head of the pancreas. The  appendix is not well seen. No inflammatory changes to suggest appendicitis are noted. Mild diverticular change of  the colon is noted without evidence of diverticulitis. The small bowel is otherwise within normal limits. Vascular/Lymphatic: Aortic atherosclerosis. No enlarged abdominal or pelvic lymph nodes. Reproductive: Status post hysterectomy. No adnexal masses. Other: No abdominal wall hernia or abnormality. No abdominopelvic ascites. Musculoskeletal: Degenerative changes of the lumbar spine are seen. No compression deformities are noted. IMPRESSION: Mild bibasilar atelectatic changes. Renal cystic change.  No obstruction is noted. Diverticulosis without evidence of diverticulitis. Gastrostomy in place. No acute abnormality noted. Electronically Signed   By: Alcide Clever M.D.   On: 06/12/2018 07:54   Dg Chest Port 1 View  Result Date: 06/11/2018 CLINICAL DATA:  Chest tightness EXAM: PORTABLE CHEST 1 VIEW COMPARISON:  06/04/2018 FINDINGS: Unchanged position of tracheostomy tube. Heart size is normal. There is calcific aortic atherosclerosis. Mild diffuse interstitial coarsening. No pleural effusion or pneumothorax. No focal consolidation pulmonary edema. IMPRESSION: Unchanged position of tracheostomy tube.  No focal airspace disease. Electronically Signed   By: Deatra Robinson M.D.   On: 06/11/2018 16:19   Dg Abd Portable 1v  Result Date: 06/11/2018 CLINICAL DATA:  Abdominal distension EXAM: PORTABLE ABDOMEN - 1 VIEW COMPARISON:  05/25/2018 FINDINGS: Unchanged position of percutaneous gastrostomy tube. There is a prominent loop of bowel in the right lower quadrant, but no dilated small bowel visualized. Visceral shadows are normal. No abnormal calcifications. Mild bilateral hip osteoarthrosis. IMPRESSION: Nonobstructive bowel gas pattern. Electronically Signed   By: Deatra Robinson M.D.   On: 06/11/2018 16:17     IMPRESSION:   *    Dark/black FOBT + stools.  *     Acute on chronic anemia.  Received 1  U RBCs earlier this week.  *     Status post tracheostomy for acute on chronic respiratory failure, pneumonia. Currently has elevated WBCs but no fever.  The leukocytosis may be due to chronic prednisone currently at high dose of 40 mg a day.  *     Atrial fibrillation, currently controlled.  RRR on exam bedside telemetry monitor today.   PLAN:     *    EGD.  I will make arrangements for this to happen tomorrow.  This was discussed with the patient and her husband.  They understand that she will be sedated and there are some associated risks but are willing to undergo the study in order to determine the source of anemia/bleeding At present I think her health is too frail to undergo colonoscopy.   Jennye Moccasin  06/13/2018, 2:58 PM Phone [phone]

## 2018-06-13 NOTE — Progress Notes (Addendum)
[facility] Critical [facility]   PULMONARY [facility]ITICAL [facility]ARE SERVICE  PROGRESS NOTE  Date of Service: 06/13/2018  Brittney Lowe  WUJ:[id number]  DOB: Jun 11, 1950   DOA: 04/27/2018  Referring Physician: Carron CurieAli Hijazi, MD  HPI: Brittney Lowe is a 68 y.o. female seen for follow up of Acute on Chronic Respiratory Failure.  Patient remains capped now for over a week.  She has a EGD scheduled for tomorrow.  After this EGD we could possibly decannulate.  Patient continues to use 2 L of oxygen via nasal cannula.  Medications: Reviewed on Rounds  Physical Exam:  Vitals: Pulse 74 respirations 25 BP 120/53 O2 sat 99% temp 98.0  Ventilator Settings patient's not currently on ventilator.  . General: Comfortable at this time . Eyes: Grossly normal lids, irises & conjunctiva . ENT: grossly tongue is normal . Neck: no obvious mass . Cardiovascular: S1 S2 normal no gallop . Respiratory: Scattered rhonchi . Abdomen: soft . Skin: no rash seen on limited exam . Musculoskeletal: not rigid . Psychiatric:unable to assess . Neurologic: no seizure no involuntary movements         Lab Data:   Basic Metabolic Panel: Recent Labs  Lab 06/11/18 0535 06/12/18 0552  NA 144 138  K 2.9* 4.5  CL 100 98  CO2 35* 29  GLUCOSE 125* 91  BUN 25* 13  CREATININE 0.35* 0.31*  CALCIUM 9.1 9.1    ABG: No results for input(s): PHART, PCO2ART, PO2ART, HCO3, O2SAT in the last 168 hours.  Liver Function Tests: Recent Labs  Lab 06/12/18 0552  AST 17  ALT 27  ALKPHOS 24*  BILITOT 0.3  PROT 5.1*  ALBUMIN 2.8*   No results for input(s): LIPASE, AMYLASE in the last 168 hours. No results for input(s): AMMONIA in the last 168 hours.  CBC: Recent Labs  Lab 06/11/18 0535 06/12/18 0552 06/13/18 0508  WBC 19.7* 19.5* 20.7*  NEUTROABS  --  13.0*  --   HGB 6.6* 9.0* 8.6*  HCT 23.9* 30.7* 29.3*  MCV 83.6 82.7 83.0  PLT 1,057* 936* 959*    Cardiac Enzymes: No  results for input(s): CKTOTAL, CKMB, CKMBINDEX, TROPONINI in the last 168 hours.  BNP (last 3 results) No results for input(s): BNP in the last 8760 hours.  ProBNP (last 3 results) No results for input(s): PROBNP in the last 8760 hours.  Radiological Exams: Ct Abdomen Pelvis W Contrast  Result Date: 06/12/2018 CLINICAL DATA:  Abdominal pain and decreased hemoglobin EXAM: CT ABDOMEN AND PELVIS WITH CONTRAST TECHNIQUE: Multidetector CT imaging of the abdomen and pelvis was performed using the standard protocol following bolus administration of intravenous contrast. CONTRAST:  80mL OMNIPAQUE IOHEXOL 300 MG/ML  SOLN COMPARISON:  06/11/2018 FINDINGS: Lower chest: Lung bases demonstrate mild bibasilar atelectasis posteriorly not well appreciated on prior plain film examination. Hepatobiliary: No focal liver abnormality is seen. Status post cholecystectomy. Mild fullness of the common bile duct is noted consistent with the post cholecystectomy state. Pancreas: Unremarkable. No pancreatic ductal dilatation or surrounding inflammatory changes. Spleen: Normal in size without focal abnormality. Adrenals/Urinary Tract: Adrenal glands are within normal limits. Kidneys are well visualized bilaterally with scattered renal cystic change. No renal calculi or obstructive changes are noted. The bladder is partially distended. Stomach/Bowel: Gastrostomy catheter is noted in place. Stomach is decompressed. A prominent duodenal diverticulum is noted adjacent to the head of the pancreas. The appendix is not well seen. No inflammatory changes to suggest appendicitis are noted. Mild diverticular change  of the colon is noted without evidence of diverticulitis. The small bowel is otherwise within normal limits. Vascular/Lymphatic: Aortic atherosclerosis. No enlarged abdominal or pelvic lymph nodes. Reproductive: Status post hysterectomy. No adnexal masses. Other: No abdominal wall hernia or abnormality. No abdominopelvic ascites.  Musculoskeletal: Degenerative changes of the lumbar spine are seen. No compression deformities are noted. IMPRESSION: Mild bibasilar atelectatic changes. Renal cystic change.  No obstruction is noted. Diverticulosis without evidence of diverticulitis. Gastrostomy in place. No acute abnormality noted. Electronically Signed   By: Alcide Clever M.D.   On: 06/12/2018 07:54    Assessment/Plan Active Problems:   Acute on chronic respiratory failure with hypoxia (HCC)   COPD, severe (HCC)   Rheumatoid arthritis (HCC)   Steroid-dependent chronic obstructive pulmonary disease (HCC)   Atrial fibrillation, chronic   1. Acute on chronic respiratory failure with hypoxia continue to monitor patient for further issues.  Continue 2 L of oxygen via nasal cannula. 2. Severe COPD advanced disease continue present management 3. Steroid dependence continue supportive care 4. Chronic atrial fibrillation rate controlled 5. Rheumatoid arthritis stable   I have personally seen and evaluated the patient, evaluated laboratory and imaging results, formulated the assessment and plan and placed orders. The Patient requires high complexity decision making for assessment and support.  Case was discussed on Rounds with the Respiratory Therapy Staff  Yevonne Pax, MD [facility] Pulmonary Critical Care Medicine Sleep Medicine

## 2018-06-13 NOTE — H&P (View-Only) (Signed)
[facility] Consult: 2:58 PM 06/13/2018  LOS: 0 days    Referring Provider: Dr Manson Passey  Primary Care Physician:  Dr Doylene Canning in [place] Primary Gastroenterologist:  Gentry Fitz.       Reason for Consultation: Anemia.  Dark stools.   HPI: Brittney Lowe is a 68 y.o. female.  [facility] early [facility].  She was hospitalized prior to this in [place] for close to 2 months. History CAD.  MI in 2000.  Chronic A. fib.  Rheumatoid arthritis.  Severe, steroid-dependent COPD.  GERD.  Nonbleeding peptic ulcer disease 30 years ago.  Anxiety hypertension. Previous surgeries include cholecystectomy.  Cardiac cath 2000.  Partial hysterectomy.  Spine surgery. 05/2015 colonoscopy by a Dr. Karna Christmas.   Results unknown. Patient was hospitalized with pneumonia and developed respiratory failure requiring intubation.  She ultimately ended up undergoing tracheostomy placement for vent dependence.  She had dysphagia and a feeding gastrostomy tube was placed.   Since transfer she has weaned off the vent.  Her trach is still in place.  Still receiving tube feedings.  She complains of some bloating when meds are administered through the G-tube.  Also had issues with A. fib but as of the present time, cardiologist Dr. Algie Coffer is not recommending pacemaker; instead preferring medical management. Last year she received blood transfusion for anemia.  The plan was for her to follow-up with her GI physician but this never happened.  She was also supposed to follow-up with cardiology for her atrial fibrillation. For the past 5 or 6 weeks her husband has seen dark/black stools consistently.  These are formed and not excessively frequent.  Patient not having any belly pain or nausea.   She does not use NSAIDs.  She drinks a glass of wine maybe a couple of times a year.  No history of liver disease that she knows of.  No history of bleeding disorders. This past week she has had acute on chronic anemia. IMA globin 9.1 on 04/28/2018.  Within 10 days it had gone to 8.5  >> 2 days later it was 7.4 (05/20/2018).  06/11/2018 it was 6.6.  She was transfused and hemoglobins today is 8.6.  MCV 83.  WBCs 20.7.  Platelets elevated, currently 959 but up as high as 5 7 during this admission. Stool was hemoccult positive on 2/26 and at that point she was switched from oral Protonix to IV Protonix. CT abdomen pelvis with contrast 06/12/2018 for complaint of abdominal pain and anemia.  Showed mild bibasilar atelectasis, renal cystic changes.  Diverticulosis in the colon.  G-tube in place.  No acute abnormality.   Allergies include moxifloxacin hives.  Adhesive tape causes swelling.  Amoxicillin/clavulanate vomiting abdominal pain.  Clarithromycin nausea vomiting.  Codeine nausea vomiting.  Latex rash and other.  Spiriva sore throat and throat blisters.  Sulfa unknown and nausea vomiting reactions.  Sulfamethoxazole trimethoprim nausea vomiting and unknown.  Tiotropium and and throat.     Past Medical History:  Diagnosis Date  . Acute  on chronic respiratory failure with hypoxia (HCC)   . Atrial fibrillation, chronic   . COPD, severe (HCC)   . Rheumatoid arthritis (HCC)   . Steroid-dependent chronic obstructive pulmonary disease (HCC)       Prior to Admission medications   Not on File    Scheduled Meds: Protonix 40 mg IV twice daily. Sliding scale insulin. Amiodarone 81 mg aspirin Budesonide inhaled Clonazepam Fish oil Ipratropium/albuterol inhaler Lidoderm patch. Loratadine Metoprolol Modafanil Montelukast inhaler.  Prednisone 40 mg daily Pro-stat protein supplement Tube feedings.  Infusions:  PRN Meds:    Allergies as of 04/27/2018  . (Not on File)    Family history  COPD.  Heart disease.  Lung cancer.  Social History   Socioeconomic History  . Marital status: Not on file    Spouse name: Not on file  . Number of children: Not on file  . Years of education: Not on file  . Highest education level: Not on file  Occupational History  . Not on file  Social Needs  . Financial resource strain: Not on file  . Food insecurity:    Worry: Not on file    Inability: Not on file  . Transportation needs:    Medical: Not on file    Non-medical: Not on file  Tobacco Use  . Smoking status: Not on file  Substance and Sexual Activity  . Alcohol use: Not on file  . Drug use: Not on file  . Sexual activity: Not on file  Lifestyle  . Physical activity:    Days per week: Not on file    Minutes per session: Not on file  . Stress: Not on file  Relationships  . Social connections:    Talks on phone: Not on file    Gets together: Not on file    Attends religious service: Not on file    Active member of club or organization: Not on file    Attends meetings of clubs or organizations: Not on file    Relationship status: Not on file  . Intimate partner violence:    Fear of current or ex partner: Not on file    Emotionally abused: Not on file    Physically abused: Not on file    Forced sexual activity: Not on file  Other Topics Concern  . Not on file  Social History Narrative  . Not on file    REVIEW OF SYSTEMS: Constitutional: No new weakness.  Patient is pretty debilitated having been in the hospital for a months ENT:  No nose bleeds Pulm: Breathing okay with a trach in place. CV:  No palpitations, no LE edema.  No chest pain GU:  No hematuria, no frequency GI: Per HPI. Heme: Patient denies unusual bleeding or bruising. Transfusions: Received transfusion of 1 U PRBC earlier this week. Neuro:  No headaches, no peripheral tingling or numbness Derm:  No itching, no rash or sores.  Endocrine:  No sweats or chills.  No polyuria or dysuria Immunization:  Not queried. Travel:  None beyond local counties in last few months.    PHYSICAL EXAM: Temperature 98.7.  Heart rate 70   BP 114/54.   Saturation 98%.   Durations 25. General: Chronically ill looking, frail, pale WF who is alert and comfortable. Head: No facial asymmetry or swelling.  No signs of head trauma. Eyes: No scleral icterus.  No conjunctival pallor.  EOMI. Ears: Not hard of hearing Nose: No discharge or congestion. Mouth: Slightly dry oral mucosa.  Mucosa is pink and clear.  Tongue midline. Neck: No JVD, no masses, no thyromegaly.  Achy ostomy apparatus in place.  No discharge from the trach site. Lungs: Clear with reduced breath sounds throughout.  No labored breathing. Heart: RRR.  No MRG.  S1, S2 present Abdomen: Soft.  G-tube site benign in mid abdomen.  No HSM, masses, bruits, hernias.  No tenderness..   Rectal: Not performed. Musc/Skeltl: Joint redness or swelling.  Some arthritic deformity in the hands. Extremities: No CCE. Neurologic: Alert.  Oriented x3.  Moves all 4 limbs, strength not tested.  No tremors. Skin: Suspicious rashes or sores.  No large bruises. Tattoos: None Nodes: No cervical adenopathy. Psych: Cooperative, pleasant, calm  Intake/Output from previous day: No intake/output data recorded. Intake/Output this shift: No intake/output data recorded.  LAB RESULTS: Recent Labs    06/11/18 0535 06/12/18 0552 06/13/18 0508  WBC 19.7* 19.5* 20.7*  HGB 6.6* 9.0* 8.6*  HCT 23.9* 30.7* 29.3*  PLT 1,057* 936* 959*   BMET Lab Results  Component Value Date   NA 138 06/12/2018   NA 144 06/11/2018   NA 140 06/06/2018   K 4.5 06/12/2018   K 2.9 (L) 06/11/2018   K 4.0 06/06/2018   CL 98 06/12/2018   CL 100 06/11/2018   CL 100 06/06/2018   CO2 29 06/12/2018   CO2 35 (H) 06/11/2018   CO2 32 06/06/2018   GLUCOSE 91 06/12/2018   GLUCOSE 125 (H) 06/11/2018   GLUCOSE 228 (H) 06/06/2018   BUN 13 06/12/2018   BUN 25 (H) 06/11/2018   BUN 18 06/06/2018    CREATININE 0.31 (L) 06/12/2018   CREATININE 0.35 (L) 06/11/2018   CREATININE 0.38 (L) 06/06/2018   CALCIUM 9.1 06/12/2018   CALCIUM 9.1 06/11/2018   CALCIUM 8.9 06/06/2018   LFT Recent Labs    06/12/18 0552  PROT 5.1*  ALBUMIN 2.8*  AST 17  ALT 27  ALKPHOS 24*  BILITOT 0.3   PT/INR Lab Results  Component Value Date   INR 1.2 06/11/2018   INR 1.36 05/25/2018   INR 1.77 04/28/2018   Hepatitis Panel No results for input(s): HEPBSAG, HCVAB, HEPAIGM, HEPBIGM in the last 72 hours. C-Diff No components found for: CDIFF Lipase  No results found for: LIPASE  Drugs of Abuse  No results found for: LABOPIA, COCAINSCRNUR, LABBENZ, AMPHETMU, THCU, LABBARB   RADIOLOGY STUDIES: Ct Abdomen Pelvis W Contrast  Result Date: 06/12/2018 CLINICAL DATA:  Abdominal pain and decreased hemoglobin EXAM: CT ABDOMEN AND PELVIS WITH CONTRAST TECHNIQUE: Multidetector CT imaging of the abdomen and pelvis was performed using the standard protocol following bolus administration of intravenous contrast. CONTRAST:  80mL OMNIPAQUE IOHEXOL 300 MG/ML  SOLN COMPARISON:  06/11/2018 FINDINGS: Lower chest: Lung bases demonstrate mild bibasilar atelectasis posteriorly not well appreciated on prior plain film examination. Hepatobiliary: No focal liver abnormality is seen. Status post cholecystectomy. Mild fullness of the common bile duct is noted consistent with the post cholecystectomy state. Pancreas: Unremarkable. No pancreatic ductal dilatation or surrounding inflammatory changes. Spleen: Normal in size without focal abnormality. Adrenals/Urinary Tract: Adrenal glands are within normal limits. Kidneys are well visualized bilaterally with scattered renal cystic change. No renal calculi or obstructive changes are noted. The bladder is partially distended. Stomach/Bowel: Gastrostomy catheter is noted in place. Stomach is decompressed. A prominent duodenal diverticulum is noted adjacent to the head of the pancreas. The  appendix is not well seen. No inflammatory changes to suggest appendicitis are noted. Mild diverticular change of  the colon is noted without evidence of diverticulitis. The small bowel is otherwise within normal limits. Vascular/Lymphatic: Aortic atherosclerosis. No enlarged abdominal or pelvic lymph nodes. Reproductive: Status post hysterectomy. No adnexal masses. Other: No abdominal wall hernia or abnormality. No abdominopelvic ascites. Musculoskeletal: Degenerative changes of the lumbar spine are seen. No compression deformities are noted. IMPRESSION: Mild bibasilar atelectatic changes. Renal cystic change.  No obstruction is noted. Diverticulosis without evidence of diverticulitis. Gastrostomy in place. No acute abnormality noted. Electronically Signed   By: Mark  Lukens M.D.   On: 06/12/2018 07:54   Dg Chest Port 1 View  Result Date: 06/11/2018 CLINICAL DATA:  Chest tightness EXAM: PORTABLE CHEST 1 VIEW COMPARISON:  06/04/2018 FINDINGS: Unchanged position of tracheostomy tube. Heart size is normal. There is calcific aortic atherosclerosis. Mild diffuse interstitial coarsening. No pleural effusion or pneumothorax. No focal consolidation pulmonary edema. IMPRESSION: Unchanged position of tracheostomy tube.  No focal airspace disease. Electronically Signed   By: Kevin  Herman M.D.   On: 06/11/2018 16:19   Dg Abd Portable 1v  Result Date: 06/11/2018 CLINICAL DATA:  Abdominal distension EXAM: PORTABLE ABDOMEN - 1 VIEW COMPARISON:  05/25/2018 FINDINGS: Unchanged position of percutaneous gastrostomy tube. There is a prominent loop of bowel in the right lower quadrant, but no dilated small bowel visualized. Visceral shadows are normal. No abnormal calcifications. Mild bilateral hip osteoarthrosis. IMPRESSION: Nonobstructive bowel gas pattern. Electronically Signed   By: Kevin  Herman M.D.   On: 06/11/2018 16:17     IMPRESSION:   *    Dark/black FOBT + stools.  *     Acute on chronic anemia.  Received 1  U RBCs earlier this week.  *     Status post tracheostomy for acute on chronic respiratory failure, pneumonia. Currently has elevated WBCs but no fever.  The leukocytosis may be due to chronic prednisone currently at high dose of 40 mg a day.  *     Atrial fibrillation, currently controlled.  RRR on exam bedside telemetry monitor today.   PLAN:     *    EGD.  I will make arrangements for this to happen tomorrow.  This was discussed with the patient and her husband.  They understand that she will be sedated and there are some associated risks but are willing to undergo the study in order to determine the source of anemia/bleeding At present I think her health is too frail to undergo colonoscopy.   Advik Weatherspoon  06/13/2018, 2:58 PM Phone [phone]     

## 2018-06-14 ENCOUNTER — Encounter (HOSPITAL_COMMUNITY): Payer: Medicare HMO | Admitting: Anesthesiology

## 2018-06-14 ENCOUNTER — Encounter (HOSPITAL_COMMUNITY): Payer: Self-pay | Admitting: Certified Registered"

## 2018-06-14 ENCOUNTER — Other Ambulatory Visit: Payer: Self-pay

## 2018-06-14 ENCOUNTER — Encounter
Admission: RE | Disposition: A | Discharge status: Skilled Nursing Facility | Payer: Self-pay | Source: Ambulatory Visit | Attending: Internal Medicine

## 2018-06-14 DIAGNOSIS — M069 Rheumatoid arthritis, unspecified: Secondary | ICD-10-CM | POA: Diagnosis not present

## 2018-06-14 DIAGNOSIS — I482 Chronic atrial fibrillation, unspecified: Secondary | ICD-10-CM | POA: Diagnosis not present

## 2018-06-14 DIAGNOSIS — J449 Chronic obstructive pulmonary disease, unspecified: Secondary | ICD-10-CM | POA: Diagnosis not present

## 2018-06-14 DIAGNOSIS — J9621 Acute and chronic respiratory failure with hypoxia: Secondary | ICD-10-CM | POA: Diagnosis not present

## 2018-06-14 DIAGNOSIS — D509 Iron deficiency anemia, unspecified: Secondary | ICD-10-CM

## 2018-06-14 DIAGNOSIS — Z931 Gastrostomy status: Secondary | ICD-10-CM

## 2018-06-14 HISTORY — PX: ESOPHAGOGASTRODUODENOSCOPY (EGD) WITH PROPOFOL: SHX5813

## 2018-06-14 LAB — CBC
HCT: 29.9 % — ABNORMAL LOW (ref 36.0–46.0)
Hemoglobin: 8.6 g/dL — ABNORMAL LOW (ref 12.0–15.0)
MCH: 23.4 pg — ABNORMAL LOW (ref 26.0–34.0)
MCHC: 28.8 g/dL — ABNORMAL LOW (ref 30.0–36.0)
MCV: 81.5 fL (ref 80.0–100.0)
Platelets: 926 10*3/uL (ref 150–400)
RBC: 3.67 MIL/uL — ABNORMAL LOW (ref 3.87–5.11)
RDW: 17.2 % — ABNORMAL HIGH (ref 11.5–15.5)
WBC: 19.7 10*3/uL — ABNORMAL HIGH (ref 4.0–10.5)
nRBC: 0.2 % (ref 0.0–0.2)

## 2018-06-14 LAB — CULTURE, RESPIRATORY W GRAM STAIN

## 2018-06-14 LAB — GLUCOSE, CAPILLARY: Glucose-Capillary: 91 mg/dL (ref 70–99)

## 2018-06-14 SURGERY — ESOPHAGOGASTRODUODENOSCOPY (EGD) WITH PROPOFOL
Anesthesia: Monitor Anesthesia Care

## 2018-06-14 MED ORDER — LACTATED RINGERS IV SOLN
INTRAVENOUS | Status: DC | PRN
  Administered 2018-06-14: 12:00:00 via INTRAVENOUS

## 2018-06-14 MED ORDER — LIDOCAINE 2% (20 MG/ML) 5 ML SYRINGE
INTRAMUSCULAR | Status: DC | PRN
  Administered 2018-06-14: 40 mg via INTRAVENOUS

## 2018-06-14 MED ORDER — PROPOFOL 500 MG/50ML IV EMUL
INTRAVENOUS | Status: DC | PRN
  Administered 2018-06-14: 100 ug/kg/min via INTRAVENOUS

## 2018-06-14 MED ORDER — PROPOFOL 10 MG/ML IV BOLUS
INTRAVENOUS | Status: DC | PRN
  Administered 2018-06-14 (×3): 20 mg via INTRAVENOUS

## 2018-06-14 SURGICAL SUPPLY — 14 items

## 2018-06-14 NOTE — Transfer of Care (Signed)
Immediate Anesthesia Transfer of Care Note  Patient: Brittney Lowe  Procedure(s) Performed: ESOPHAGOGASTRODUODENOSCOPY (EGD) WITH PROPOFOL (N/A )  Patient Location: Endoscopy Unit  Anesthesia Type:MAC  Level of Consciousness: awake and alert   Airway & Oxygen Therapy: Patient Spontanous Breathing and Patient connected to nasal cannula oxygen  Post-op Assessment: Report given to RN and Post -op Vital signs reviewed and stable  Post vital signs: Reviewed  Last Vitals:  Vitals Value Taken Time  BP See Flowsheet   Temp    Pulse    Resp    SpO2      Last Pain:  Vitals:   06/14/18 1112  TempSrc:   PainSc: 8          Complications: No apparent anesthesia complications

## 2018-06-14 NOTE — Op Note (Signed)
[facility] Patient Name: Brittney Lowe Procedure Date : 06/14/2018 MRN: [id number] Attending MD: Justice Britain , MD Date of Birth: [birth year] CSN: [id number] Age: 68 Admit Type: Inpatient Procedure:                Upper GI endoscopy Indications:              Unexplained iron deficiency anemia, Suspected upper                            gastrointestinal bleeding Providers:                Justice Britain, MD, Lauralyn Primes Tech., Technician, Vania Rea, CRNA Referring MD:             Dr. Laren Everts Medicines:                Monitored Anesthesia Care Complications:            No immediate complications. Estimated Blood Loss:     Estimated blood loss: none. Procedure:                Pre-Anesthesia Assessment:                           - Prior to the procedure, a History and Physical                            was performed, and patient medications and                            allergies were reviewed. The patient's tolerance of                            previous anesthesia was also reviewed. The risks                            and benefits of the procedure and the sedation                            options and risks were discussed with the patient.                            All questions were answered, and informed consent                            was obtained. Prior Anticoagulants: The patient has                            taken no previous anticoagulant or antiplatelet                            agents. ASA Grade Assessment: III - A patient with  severe systemic disease. After reviewing the risks                            and benefits, the patient was deemed in                            satisfactory condition to undergo the procedure.                           After obtaining informed consent, the endoscope was                            passed under direct vision. Throughout the                         procedure, the patient's blood pressure, pulse, and                            oxygen saturations were monitored continuously. The                            GIF-H190 ([id number] Olympus gastroscope was                            introduced through the mouth, and advanced to the                            third part of duodenum. The upper GI endoscopy was                            accomplished without difficulty. The patient                            tolerated the procedure. Scope In: Scope Out: Findings:      No gross lesions were noted in the entire esophagus.      The Z-line was regular and was found 38 cm from the incisors.      There was evidence of an intact gastrostomy with a patent G-tube present       on the greater curvature of the stomach. This was characterized by       healthy appearing mucosa.      No gross lesions were noted in the entire examined stomach.      No gross lesions were noted in the duodenal bulb, in the first portion       of the duodenum, in the second portion of the duodenum and in the third       portion of the duodenum. Impression:               - No gross lesions in esophagus.                           - Z-line regular, 38 cm from the incisors.                           - Intact gastrostomy with a patent G-tube present  characterized by healthy appearing mucosa.                           - No gross lesions in the stomach.                           - No gross lesions in the duodenal bulb, in the                            first portion of the duodenum, in the second                            portion of the duodenum and in the third portion of                            the duodenum.                           - No specimens collected. Recommendation:           - The patient will be observed post-procedure,                            until all discharge criteria are met.                           - Return  patient to hospital ward for ongoing care.                           - Trend Hgb/Hct daily for at least next 3-days.                           - As previously discussed, we feel that an                            inpatient colonoscopy will be difficult for patient                            to tolerate and she had deferred on proceeding with                            that currently.                           In the setting of her current stability, we feel                            that it is reasonable to monitor the patient and                            allow her chance to either continue to heal/improve                            and proceed as an outpatient for a  diagnostic                            colonoscopy if required vs if she manifests                            transfusion-dependent anemia with dark maroon                            stools or BRBPR, then we can consider an inpatient                            colonoscopy.                           - Observe patient's clinical course.                           - The findings and recommendations were discussed                            with the patient.                           - The findings and recommendations were discussed                            with the referring physician. Procedure Code(s):        --- Professional ---                           [id number], Esophagogastroduodenoscopy, flexible,                            transoral; diagnostic, including collection of                            specimen(s) by brushing or washing, when performed                            (separate procedure) Diagnosis Code(s):        --- Professional ---                           Z93.1, Gastrostomy status                           D50.9, Iron deficiency anemia, unspecified CPT copyright 2018 American Medical Association. All rights reserved. The codes documented in this report are preliminary and upon coder review may  be revised to  meet current compliance requirements. Justice Britain, MD 06/14/2018 12:58:51 PM Number of Addenda: 0

## 2018-06-14 NOTE — Anesthesia Preprocedure Evaluation (Signed)
Anesthesia Evaluation  Patient identified by MRN, date of birth, ID band Patient awake    Reviewed: Allergy & Precautions, NPO status , Patient's Chart, lab work & pertinent test results  Airway Mallampati: Trach   Neck ROM: full    Dental   Pulmonary COPD,  +tracheostomy   breath sounds clear to auscultation       Cardiovascular + dysrhythmias Atrial Fibrillation  Rhythm:regular Rate:Normal     Neuro/Psych    GI/Hepatic GI bleed   Endo/Other    Renal/GU      Musculoskeletal  (+) Arthritis ,   Abdominal   Peds  Hematology  (+) Blood dyscrasia, anemia ,   Anesthesia Other Findings   Reproductive/Obstetrics                             Anesthesia Physical Anesthesia Plan  ASA: III  Anesthesia Plan: MAC   Post-op Pain Management:    Induction: Intravenous  PONV Risk Score and Plan: 2 and Propofol infusion and Treatment may vary due to age or medical condition  Airway Management Planned: Tracheostomy  Additional Equipment:   Intra-op Plan:   Post-operative Plan:   Informed Consent: I have reviewed the patients History and Physical, chart, labs and discussed the procedure including the risks, benefits and alternatives for the proposed anesthesia with the patient or authorized representative who has indicated his/her understanding and acceptance.       Plan Discussed with: CRNA, Anesthesiologist and Surgeon  Anesthesia Plan Comments:         Anesthesia Quick Evaluation

## 2018-06-14 NOTE — Progress Notes (Signed)
Pulmonary Critical Care Medicine [facility] [place]   PULMONARY CRITICAL CARE SERVICE  PROGRESS NOTE  Date of Service: 06/14/2018  Brittney Lowe  WUG:[id number]  DOB: 13-Apr-1951   DOA: 04/27/2018  Referring Physician: Carron Curie, MD  HPI: Brittney Lowe is a 68 y.o. female seen for follow up of Acute on Chronic Respiratory Failure.  Patient is capping not to be decannulated she is supposed to have an EGD done  Medications: Reviewed on Rounds  Physical Exam:  Vitals: Temperature 97.1 pulse 84 respiratory rate 20 blood pressure 136/57 saturation 99%  Ventilator Settings capping off the ventilator  . General: Comfortable at this time . Eyes: Grossly normal lids, irises & conjunctiva . ENT: grossly tongue is normal . Neck: no obvious mass . Cardiovascular: S1 S2 normal no gallop . Respiratory: No rhonchi or rales are noted at this time . Abdomen: soft . Skin: no rash seen on limited exam . Musculoskeletal: not rigid . Psychiatric:unable to assess . Neurologic: no seizure no involuntary movements         Lab Data:   Basic Metabolic Panel: Recent Labs  Lab 06/11/18 0535 06/12/18 0552  NA 144 138  K 2.9* 4.5  CL 100 98  CO2 35* 29  GLUCOSE 125* 91  BUN 25* 13  CREATININE 0.35* 0.31*  CALCIUM 9.1 9.1    ABG: No results for input(s): PHART, PCO2ART, PO2ART, HCO3, O2SAT in the last 168 hours.  Liver Function Tests: Recent Labs  Lab 06/12/18 0552  AST 17  ALT 27  ALKPHOS 24*  BILITOT 0.3  PROT 5.1*  ALBUMIN 2.8*   No results for input(s): LIPASE, AMYLASE in the last 168 hours. No results for input(s): AMMONIA in the last 168 hours.  CBC: Recent Labs  Lab 06/11/18 0535 06/12/18 0552 06/13/18 0508 06/14/18 0559  WBC 19.7* 19.5* 20.7* 19.7*  NEUTROABS  --  13.0*  --   --   HGB 6.6* 9.0* 8.6* 8.6*  HCT 23.9* 30.7* 29.3* 29.9*  MCV 83.6 82.7 83.0 81.5  PLT 1,057* 936* 959* 926*    Cardiac Enzymes: No results for input(s):  CKTOTAL, CKMB, CKMBINDEX, TROPONINI in the last 168 hours.  BNP (last 3 results) No results for input(s): BNP in the last 8760 hours.  ProBNP (last 3 results) No results for input(s): PROBNP in the last 8760 hours.  Radiological Exams: No results found.  Assessment/Plan Active Problems:   Acute on chronic respiratory failure with hypoxia (HCC)   COPD, severe (HCC)   Rheumatoid arthritis (HCC)   Steroid-dependent chronic obstructive pulmonary disease (HCC)   Atrial fibrillation, chronic   1. Acute on chronic respiratory failure with hypoxia we will continue with the capping as ordered once the EGD is done I think she should be able to decannulate 2. Severe COPD advanced disease continue present management 3. Rheumatoid arthritis continue with supportive care 4. Steroid dependence for COPD 5. Chronic atrial fibrillation rate controlled   I have personally seen and evaluated the patient, evaluated laboratory and imaging results, formulated the assessment and plan and placed orders. The Patient requires high complexity decision making for assessment and support.  Case was discussed on Rounds with the Respiratory Therapy Staff  Yevonne Pax, MD [facility] Pulmonary Critical Care Medicine Sleep Medicine

## 2018-06-14 NOTE — Interval H&P Note (Signed)
History and Physical Interval Note:  06/14/2018 11:38 AM  Brittney Lowe  has presented today for surgery, with the diagnosis of Anemia.  Black, FOBT positive stool  The various methods of treatment have been discussed with the patient and family. After consideration of risks, benefits and other options for treatment, the patient has consented to  Procedure(s) with comments: ESOPHAGOGASTRODUODENOSCOPY (EGD) WITH PROPOFOL (N/A) - trach pt but not on vent as a surgical intervention .  The patient's history has been reviewed, patient examined, no change in status, stable for surgery.  I have reviewed the patient's chart and labs.  Questions were answered to the patient's satisfaction.     [employer]

## 2018-06-15 DIAGNOSIS — M069 Rheumatoid arthritis, unspecified: Secondary | ICD-10-CM | POA: Diagnosis not present

## 2018-06-15 DIAGNOSIS — J9621 Acute and chronic respiratory failure with hypoxia: Secondary | ICD-10-CM | POA: Diagnosis not present

## 2018-06-15 DIAGNOSIS — I482 Chronic atrial fibrillation, unspecified: Secondary | ICD-10-CM | POA: Diagnosis not present

## 2018-06-15 DIAGNOSIS — J449 Chronic obstructive pulmonary disease, unspecified: Secondary | ICD-10-CM | POA: Diagnosis not present

## 2018-06-15 LAB — CBC
HCT: 32.9 % — ABNORMAL LOW (ref 36.0–46.0)
HEMOGLOBIN: 9.7 g/dL — AB (ref 12.0–15.0)
MCH: 24.1 pg — ABNORMAL LOW (ref 26.0–34.0)
MCHC: 29.5 g/dL — ABNORMAL LOW (ref 30.0–36.0)
MCV: 81.6 fL (ref 80.0–100.0)
Platelets: 922 10*3/uL (ref 150–400)
RBC: 4.03 MIL/uL (ref 3.87–5.11)
RDW: 17.3 % — ABNORMAL HIGH (ref 11.5–15.5)
WBC: 16.8 10*3/uL — ABNORMAL HIGH (ref 4.0–10.5)
nRBC: 0 % (ref 0.0–0.2)

## 2018-06-15 NOTE — Progress Notes (Addendum)
Pulmonary Critical Care Medicine [facility] [place]   PULMONARY CRITICAL CARE SERVICE  PROGRESS NOTE  Date of Service: 06/15/2018  Brittney Lowe  [id number]  DOB: January 19, 1951   DOA: 04/27/2018  Referring Physician: Carron Curie, MD  HPI: Brittney Lowe is a 68 y.o. female seen for follow up of Acute on Chronic Respiratory Failure.  Patient's EGD was done and no significant issues are noted from a respiratory standpoint.  Patient has been capped now for multiple days and will decannulate patient today.    Medications: Reviewed on Rounds  Physical Exam:  Vitals: Pulse 92 respirations 19 BP 108/81 O2 sat 99% temp 97.0  Ventilator Settings not currently on ventilator  . General: Comfortable at this time . Eyes: Grossly normal lids, irises & conjunctiva . ENT: grossly tongue is normal . Neck: no obvious mass . Cardiovascular: S1 S2 normal no gallop . Respiratory: No rales or rhonchi noted . Abdomen: soft . Skin: no rash seen on limited exam . Musculoskeletal: not rigid . Psychiatric:unable to assess . Neurologic: no seizure no involuntary movements         Lab Data:   Basic Metabolic Panel: Recent Labs  Lab 06/11/18 0535 06/12/18 0552  NA 144 138  K 2.9* 4.5  CL 100 98  CO2 35* 29  GLUCOSE 125* 91  BUN 25* 13  CREATININE 0.35* 0.31*  CALCIUM 9.1 9.1    ABG: No results for input(s): PHART, PCO2ART, PO2ART, HCO3, O2SAT in the last 168 hours.  Liver Function Tests: Recent Labs  Lab 06/12/18 0552  AST 17  ALT 27  ALKPHOS 24*  BILITOT 0.3  PROT 5.1*  ALBUMIN 2.8*   No results for input(s): LIPASE, AMYLASE in the last 168 hours. No results for input(s): AMMONIA in the last 168 hours.  CBC: Recent Labs  Lab 06/11/18 0535 06/12/18 0552 06/13/18 0508 06/14/18 0559 06/15/18 0606  WBC 19.7* 19.5* 20.7* 19.7* 16.8*  NEUTROABS  --  13.0*  --   --   --   HGB 6.6* 9.0* 8.6* 8.6* 9.7*  HCT 23.9* 30.7* 29.3* 29.9* 32.9*  MCV 83.6 82.7  83.0 81.5 81.6  PLT 1,057* 936* 959* 926* 922*    Cardiac Enzymes: No results for input(s): CKTOTAL, CKMB, CKMBINDEX, TROPONINI in the last 168 hours.  BNP (last 3 results) No results for input(s): BNP in the last 8760 hours.  ProBNP (last 3 results) No results for input(s): PROBNP in the last 8760 hours.  Radiological Exams: No results found.  Assessment/Plan Active Problems:   Acute on chronic respiratory failure with hypoxia (HCC)   COPD, severe (HCC)   Rheumatoid arthritis (HCC)   Steroid-dependent chronic obstructive pulmonary disease (HCC)   Atrial fibrillation, chronic   1. Acute on chronic respiratory failure with hypoxia patient has been capped now for multiple days EGD is complete order to decannulate placed today. 2. Severe COPD advanced disease continue present management. 3. Rheumatoid arthritis continue with supportive care 4. Steroid dependence for COPD 5. Chronic atrial fibrillation rate controlled   I have personally seen and evaluated the patient, evaluated laboratory and imaging results, formulated the assessment and plan and placed orders. The Patient requires high complexity decision making for assessment and support.  Case was discussed on Rounds with the Respiratory Therapy Staff  Yevonne Pax, MD [facility] Pulmonary Critical Care Medicine Sleep Medicine

## 2018-06-16 ENCOUNTER — Encounter (HOSPITAL_COMMUNITY): Payer: Self-pay | Admitting: Gastroenterology

## 2018-06-16 DIAGNOSIS — J9621 Acute and chronic respiratory failure with hypoxia: Secondary | ICD-10-CM | POA: Diagnosis not present

## 2018-06-16 DIAGNOSIS — J449 Chronic obstructive pulmonary disease, unspecified: Secondary | ICD-10-CM | POA: Diagnosis not present

## 2018-06-16 DIAGNOSIS — I482 Chronic atrial fibrillation, unspecified: Secondary | ICD-10-CM | POA: Diagnosis not present

## 2018-06-16 DIAGNOSIS — M069 Rheumatoid arthritis, unspecified: Secondary | ICD-10-CM | POA: Diagnosis not present

## 2018-06-16 LAB — CBC
HCT: 29.7 % — ABNORMAL LOW (ref 36.0–46.0)
Hemoglobin: 8.7 g/dL — ABNORMAL LOW (ref 12.0–15.0)
MCH: 23.7 pg — ABNORMAL LOW (ref 26.0–34.0)
MCHC: 29.3 g/dL — ABNORMAL LOW (ref 30.0–36.0)
MCV: 80.9 fL (ref 80.0–100.0)
Platelets: 790 10*3/uL — ABNORMAL HIGH (ref 150–400)
RBC: 3.67 MIL/uL — ABNORMAL LOW (ref 3.87–5.11)
RDW: 17.3 % — ABNORMAL HIGH (ref 11.5–15.5)
WBC: 18.2 10*3/uL — ABNORMAL HIGH (ref 4.0–10.5)
nRBC: 0 % (ref 0.0–0.2)

## 2018-06-16 LAB — CULTURE, BLOOD (ROUTINE X 2)
Culture: NO GROWTH
Culture: NO GROWTH
Special Requests: ADEQUATE

## 2018-06-16 NOTE — Progress Notes (Addendum)
Pulmonary Critical Care Medicine [facility] [place]   PULMONARY CRITICAL CARE SERVICE  PROGRESS NOTE  Date of Service: 06/16/2018  Brittney Lowe  [id number]  DOB: January 22, 1951   DOA: 04/27/2018  Referring Physician: Carron Curie, MD  HPI: Brittney Lowe is a 68 y.o. female seen for follow up of Acute on Chronic Respiratory Failure.  Patient remains decannulated today on 2 L of oxygen via nasal cannula.  Currently doing well at this time without acute distress noted.  Medications: Reviewed on Rounds  Physical Exam:  Vitals: Pulse 78 respirations 29 BP 119/54 O2 sat 98% 97.8  Ventilator Settings patient decannulated not on ventilator  . General: Comfortable at this time . Eyes: Grossly normal lids, irises & conjunctiva . ENT: grossly tongue is normal . Neck: no obvious mass . Cardiovascular: S1 S2 normal no gallop . Respiratory: No rales or rhonchi noted . Abdomen: soft . Skin: no rash seen on limited exam . Musculoskeletal: not rigid . Psychiatric:unable to assess . Neurologic: no seizure no involuntary movements         Lab Data:   Basic Metabolic Panel: Recent Labs  Lab 06/11/18 0535 06/12/18 0552  NA 144 138  K 2.9* 4.5  CL 100 98  CO2 35* 29  GLUCOSE 125* 91  BUN 25* 13  CREATININE 0.35* 0.31*  CALCIUM 9.1 9.1    ABG: No results for input(s): PHART, PCO2ART, PO2ART, HCO3, O2SAT in the last 168 hours.  Liver Function Tests: Recent Labs  Lab 06/12/18 0552  AST 17  ALT 27  ALKPHOS 24*  BILITOT 0.3  PROT 5.1*  ALBUMIN 2.8*   No results for input(s): LIPASE, AMYLASE in the last 168 hours. No results for input(s): AMMONIA in the last 168 hours.  CBC: Recent Labs  Lab 06/12/18 0552 06/13/18 0508 06/14/18 0559 06/15/18 0606 06/16/18 0453  WBC 19.5* 20.7* 19.7* 16.8* 18.2*  NEUTROABS 13.0*  --   --   --   --   HGB 9.0* 8.6* 8.6* 9.7* 8.7*  HCT 30.7* 29.3* 29.9* 32.9* 29.7*  MCV 82.7 83.0 81.5 81.6 80.9  PLT 936* 959*  926* 922* 790*    Cardiac Enzymes: No results for input(s): CKTOTAL, CKMB, CKMBINDEX, TROPONINI in the last 168 hours.  BNP (last 3 results) No results for input(s): BNP in the last 8760 hours.  ProBNP (last 3 results) No results for input(s): PROBNP in the last 8760 hours.  Radiological Exams: No results found.  Assessment/Plan Active Problems:   Acute on chronic respiratory failure with hypoxia (HCC)   COPD, severe (HCC)   Rheumatoid arthritis (HCC)   Steroid-dependent chronic obstructive pulmonary disease (HCC)   Atrial fibrillation, chronic   1. Acute on chronic respiratory failure with hypoxia, patient was decannulated yesterday and is doing well on 2 L of oxygen via nasal cannula at this time. 2. Severe COPD advanced age continue present management 3. Rheumatoid arthritis continue with supportive care 4. Steroid dependence for COPD 5. Chronic atrial fibrillation rate controlled   I have personally seen and evaluated the patient, evaluated laboratory and imaging results, formulated the assessment and plan and placed orders. The Patient requires high complexity decision making for assessment and support.  Case was discussed on Rounds with the Respiratory Therapy Staff  Yevonne Pax, MD [facility] Pulmonary Critical Care Medicine Sleep Medicine

## 2018-06-17 DIAGNOSIS — J9621 Acute and chronic respiratory failure with hypoxia: Secondary | ICD-10-CM | POA: Diagnosis not present

## 2018-06-17 DIAGNOSIS — J449 Chronic obstructive pulmonary disease, unspecified: Secondary | ICD-10-CM | POA: Diagnosis not present

## 2018-06-17 DIAGNOSIS — I482 Chronic atrial fibrillation, unspecified: Secondary | ICD-10-CM | POA: Diagnosis not present

## 2018-06-17 DIAGNOSIS — M069 Rheumatoid arthritis, unspecified: Secondary | ICD-10-CM | POA: Diagnosis not present

## 2018-06-17 LAB — CBC
HEMATOCRIT: 30.3 % — AB (ref 36.0–46.0)
HEMOGLOBIN: 8.8 g/dL — AB (ref 12.0–15.0)
MCH: 23.6 pg — ABNORMAL LOW (ref 26.0–34.0)
MCHC: 29 g/dL — ABNORMAL LOW (ref 30.0–36.0)
MCV: 81.2 fL (ref 80.0–100.0)
Platelets: 682 10*3/uL — ABNORMAL HIGH (ref 150–400)
RBC: 3.73 MIL/uL — ABNORMAL LOW (ref 3.87–5.11)
RDW: 17.2 % — ABNORMAL HIGH (ref 11.5–15.5)
WBC: 16.6 10*3/uL — ABNORMAL HIGH (ref 4.0–10.5)
nRBC: 0 % (ref 0.0–0.2)

## 2018-06-17 NOTE — Progress Notes (Addendum)
Pulmonary Critical Care Medicine [facility] [place]   PULMONARY CRITICAL CARE SERVICE  PROGRESS NOTE  Date of Service: 06/17/2018  Brittney Lowe  [id number]  DOB: 1951/02/09   DOA: 04/27/2018  Referring Physician: Carron Curie, MD  HPI: Brittney Lowe is a 68 y.o. female seen for follow up of Acute on Chronic Respiratory Failure.  Patient is doing well since decannulation.  Currently requiring 2 L of oxygen via nasal cannula.  No acute distress noted at this time.  Medications: Reviewed on Rounds  Physical Exam:  Vitals: Pulse 70 respirations 27 BP 111/57 O2 sat 100% temp 97.0  Ventilator Settings not currently on ventilator  . General: Comfortable at this time . Eyes: Grossly normal lids, irises & conjunctiva . ENT: grossly tongue is normal . Neck: no obvious mass . Cardiovascular: S1 S2 normal no gallop . Respiratory: No rales or rhonchi noted . Abdomen: soft . Skin: no rash seen on limited exam . Musculoskeletal: not rigid . Psychiatric:unable to assess . Neurologic: no seizure no involuntary movements         Lab Data:   Basic Metabolic Panel: Recent Labs  Lab 06/11/18 0535 06/12/18 0552  NA 144 138  K 2.9* 4.5  CL 100 98  CO2 35* 29  GLUCOSE 125* 91  BUN 25* 13  CREATININE 0.35* 0.31*  CALCIUM 9.1 9.1    ABG: No results for input(s): PHART, PCO2ART, PO2ART, HCO3, O2SAT in the last 168 hours.  Liver Function Tests: Recent Labs  Lab 06/12/18 0552  AST 17  ALT 27  ALKPHOS 24*  BILITOT 0.3  PROT 5.1*  ALBUMIN 2.8*   No results for input(s): LIPASE, AMYLASE in the last 168 hours. No results for input(s): AMMONIA in the last 168 hours.  CBC: Recent Labs  Lab 06/12/18 0552 06/13/18 0508 06/14/18 0559 06/15/18 0606 06/16/18 0453 06/17/18 0502  WBC 19.5* 20.7* 19.7* 16.8* 18.2* 16.6*  NEUTROABS 13.0*  --   --   --   --   --   HGB 9.0* 8.6* 8.6* 9.7* 8.7* 8.8*  HCT 30.7* 29.3* 29.9* 32.9* 29.7* 30.3*  MCV 82.7 83.0  81.5 81.6 80.9 81.2  PLT 936* 959* 926* 922* 790* 682*    Cardiac Enzymes: No results for input(s): CKTOTAL, CKMB, CKMBINDEX, TROPONINI in the last 168 hours.  BNP (last 3 results) No results for input(s): BNP in the last 8760 hours.  ProBNP (last 3 results) No results for input(s): PROBNP in the last 8760 hours.  Radiological Exams: No results found.  Assessment/Plan Active Problems:   Acute on chronic respiratory failure with hypoxia (HCC)   COPD, severe (HCC)   Rheumatoid arthritis (HCC)   Steroid-dependent chronic obstructive pulmonary disease (HCC)   Atrial fibrillation, chronic   1. Acute on chronic respiratory failure with hypoxia patient is now been 2 days decannulated doing well on 2 L of oxygen.  Continue supportive measures at this time. 2. Severe COPD advanced age continue present management 3. Rheumatoid arthritis continue with supportive care 4. Steroid dependence for COPD 5. Chronic atrial fibrillation rate controlled   I have personally seen and evaluated the patient, evaluated laboratory and imaging results, formulated the assessment and plan and placed orders. The Patient requires high complexity decision making for assessment and support.  Case was discussed on Rounds with the Respiratory Therapy Staff  Yevonne Pax, MD [facility] Pulmonary Critical Care Medicine Sleep Medicine

## 2018-06-18 ENCOUNTER — Other Ambulatory Visit (HOSPITAL_COMMUNITY): Payer: Medicare HMO

## 2018-06-18 DIAGNOSIS — M069 Rheumatoid arthritis, unspecified: Secondary | ICD-10-CM | POA: Diagnosis not present

## 2018-06-18 DIAGNOSIS — J449 Chronic obstructive pulmonary disease, unspecified: Secondary | ICD-10-CM | POA: Diagnosis not present

## 2018-06-18 DIAGNOSIS — J9621 Acute and chronic respiratory failure with hypoxia: Secondary | ICD-10-CM | POA: Diagnosis not present

## 2018-06-18 DIAGNOSIS — I482 Chronic atrial fibrillation, unspecified: Secondary | ICD-10-CM | POA: Diagnosis not present

## 2018-06-18 NOTE — Progress Notes (Signed)
Pulmonary Critical Care Medicine [facility] [place]   PULMONARY CRITICAL CARE SERVICE  PROGRESS NOTE  Date of Service: 06/18/2018  [name]  [id number]  DOB: Oct 16, 1950   DOA: 04/27/2018  Referring Physician: Carron Curie, MD  HPI: Brittney Lowe is a 68 y.o. female seen for follow up of Acute on Chronic Respiratory Failure.  She is doing well decannulated on cannula she is actually doing better  Medications: Reviewed on Rounds  Physical Exam:  Vitals: Temperature 97.4 pulse 75 respiratory 14 blood pressure 108/66 saturations 100%  Ventilator Settings currently is on oxygen  . General: Comfortable at this time . Eyes: Grossly normal lids, irises & conjunctiva . ENT: grossly tongue is normal . Neck: no obvious mass . Cardiovascular: S1 S2 normal no gallop . Respiratory: No rhonchi or rales are noted . Abdomen: soft . Skin: no rash seen on limited exam . Musculoskeletal: not rigid . Psychiatric:unable to assess . Neurologic: no seizure no involuntary movements         Lab Data:   Basic Metabolic Panel: Recent Labs  Lab 06/12/18 0552  NA 138  K 4.5  CL 98  CO2 29  GLUCOSE 91  BUN 13  CREATININE 0.31*  CALCIUM 9.1    ABG: No results for input(s): PHART, PCO2ART, PO2ART, HCO3, O2SAT in the last 168 hours.  Liver Function Tests: Recent Labs  Lab 06/12/18 0552  AST 17  ALT 27  ALKPHOS 24*  BILITOT 0.3  PROT 5.1*  ALBUMIN 2.8*   No results for input(s): LIPASE, AMYLASE in the last 168 hours. No results for input(s): AMMONIA in the last 168 hours.  CBC: Recent Labs  Lab 06/12/18 0552 06/13/18 0508 06/14/18 0559 06/15/18 0606 06/16/18 0453 06/17/18 0502  WBC 19.5* 20.7* 19.7* 16.8* 18.2* 16.6*  NEUTROABS 13.0*  --   --   --   --   --   HGB 9.0* 8.6* 8.6* 9.7* 8.7* 8.8*  HCT 30.7* 29.3* 29.9* 32.9* 29.7* 30.3*  MCV 82.7 83.0 81.5 81.6 80.9 81.2  PLT 936* 959* 926* 922* 790* 682*    Cardiac Enzymes: No results for  input(s): CKTOTAL, CKMB, CKMBINDEX, TROPONINI in the last 168 hours.  BNP (last 3 results) No results for input(s): BNP in the last 8760 hours.  ProBNP (last 3 results) No results for input(s): PROBNP in the last 8760 hours.  Radiological Exams: No results found.  Assessment/Plan Active Problems:   Acute on chronic respiratory failure with hypoxia (HCC)   COPD, severe (HCC)   Rheumatoid arthritis (HCC)   Steroid-dependent chronic obstructive pulmonary disease (HCC)   Atrial fibrillation, chronic   1. Acute on chronic respiratory failure with hypoxia continue with supportive care awaiting discharge this week 2. Severe COPD at baseline 3. Rheumatoid arthritis treated 4. Steroid-dependent COPD continue with supportive care 5. Chronic atrial fibrillation at baseline   I have personally seen and evaluated the patient, evaluated laboratory and imaging results, formulated the assessment and plan and placed orders. The Patient requires high complexity decision making for assessment and support.  Case was discussed on Rounds with the Respiratory Therapy Staff  Yevonne Pax, MD [facility] Pulmonary Critical Care Medicine Sleep Medicine

## 2018-06-19 DIAGNOSIS — J9621 Acute and chronic respiratory failure with hypoxia: Secondary | ICD-10-CM | POA: Diagnosis not present

## 2018-06-19 DIAGNOSIS — M069 Rheumatoid arthritis, unspecified: Secondary | ICD-10-CM | POA: Diagnosis not present

## 2018-06-19 DIAGNOSIS — J449 Chronic obstructive pulmonary disease, unspecified: Secondary | ICD-10-CM | POA: Diagnosis not present

## 2018-06-19 DIAGNOSIS — I482 Chronic atrial fibrillation, unspecified: Secondary | ICD-10-CM | POA: Diagnosis not present

## 2018-06-19 NOTE — Progress Notes (Signed)
Pulmonary Critical Care Medicine [facility] [place]   PULMONARY CRITICAL CARE SERVICE  PROGRESS NOTE  Date of Service: 06/19/2018  Brittney Lowe  [id number]  DOB: [birth year]   DOA: 04/27/2018  Referring Physician: Carron Curie, MD  HPI: Brittney Lowe is a 68 y.o. female seen for follow up of Acute on Chronic Respiratory Failure.  Patient is currently on nasal cannula she is doing well stoma is almost closed  Medications: Reviewed on Rounds  Physical Exam:  Vitals: Temperature 98.2 pulse 73 respiratory 36 blood pressure 131/58 saturations 98%  Ventilator Settings off the ventilator on nasal cannula  . General: Comfortable at this time . Eyes: Grossly normal lids, irises & conjunctiva . ENT: grossly tongue is normal . Neck: no obvious mass . Cardiovascular: S1 S2 normal no gallop . Respiratory: No rhonchi or rales are noted . Abdomen: soft . Skin: no rash seen on limited exam . Musculoskeletal: not rigid . Psychiatric:unable to assess . Neurologic: no seizure no involuntary movements         Lab Data:   Basic Metabolic Panel: No results for input(s): NA, K, CL, CO2, GLUCOSE, BUN, CREATININE, CALCIUM, MG, PHOS in the last 168 hours.  ABG: No results for input(s): PHART, PCO2ART, PO2ART, HCO3, O2SAT in the last 168 hours.  Liver Function Tests: No results for input(s): AST, ALT, ALKPHOS, BILITOT, PROT, ALBUMIN in the last 168 hours. No results for input(s): LIPASE, AMYLASE in the last 168 hours. No results for input(s): AMMONIA in the last 168 hours.  CBC: Recent Labs  Lab 06/13/18 0508 06/14/18 0559 06/15/18 0606 06/16/18 0453 06/17/18 0502  WBC 20.7* 19.7* 16.8* 18.2* 16.6*  HGB 8.6* 8.6* 9.7* 8.7* 8.8*  HCT 29.3* 29.9* 32.9* 29.7* 30.3*  MCV 83.0 81.5 81.6 80.9 81.2  PLT 959* 926* 922* 790* 682*    Cardiac Enzymes: No results for input(s): CKTOTAL, CKMB, CKMBINDEX, TROPONINI in the last 168 hours.  BNP (last 3 results) No  results for input(s): BNP in the last 8760 hours.  ProBNP (last 3 results) No results for input(s): PROBNP in the last 8760 hours.  Radiological Exams: No results found.  Assessment/Plan Active Problems:   Acute on chronic respiratory failure with hypoxia (HCC)   COPD, severe (HCC)   Rheumatoid arthritis (HCC)   Steroid-dependent chronic obstructive pulmonary disease (HCC)   Atrial fibrillation, chronic   1. Acute on chronic respiratory failure with hypoxia we will continue with nasal cannula continue secretion management pulmonary toilet 2. Severe COPD at baseline 3. Rheumatoid arthritis treated improving 4. 6 steroid-dependent continue with support supportive care 5. Chronic atrial fibrillation rate controlled   I have personally seen and evaluated the patient, evaluated laboratory and imaging results, formulated the assessment and plan and placed orders. The Patient requires high complexity decision making for assessment and support.  Case was discussed on Rounds with the Respiratory Therapy Staff  Yevonne Pax, MD [facility] Pulmonary Critical Care Medicine Sleep Medicine

## 2018-06-20 DIAGNOSIS — M069 Rheumatoid arthritis, unspecified: Secondary | ICD-10-CM | POA: Diagnosis not present

## 2018-06-20 DIAGNOSIS — J9621 Acute and chronic respiratory failure with hypoxia: Secondary | ICD-10-CM | POA: Diagnosis not present

## 2018-06-20 DIAGNOSIS — I482 Chronic atrial fibrillation, unspecified: Secondary | ICD-10-CM | POA: Diagnosis not present

## 2018-06-20 DIAGNOSIS — J449 Chronic obstructive pulmonary disease, unspecified: Secondary | ICD-10-CM | POA: Diagnosis not present

## 2018-06-20 NOTE — Progress Notes (Addendum)
Pulmonary Critical Care Medicine [facility] [place]   PULMONARY CRITICAL CARE SERVICE  PROGRESS NOTE  Date of Service: 06/20/2018  Brittney Lowe  [id number]  DOB: December 09, 1950   DOA: 04/27/2018  Referring Physician: Carron Curie, MD  HPI: Brittney Lowe is a 68 y.o. female seen for follow up of Acute on Chronic Respiratory Failure.  Patient remains decannulated today.  Most likely will be discharged tomorrow.  Continue on 2 L of oxygen via nasal cannula no acute distress noted at this time.  Medications: Reviewed on Rounds  Physical Exam:  Vitals: Pulse 76 respirations 16 BP 148/58 O2 sat 98% temp 96.9  Ventilator Settings not currently on ventilator and decannulated.  . General: Comfortable at this time . Eyes: Grossly normal lids, irises & conjunctiva . ENT: grossly tongue is normal . Neck: no obvious mass . Cardiovascular: S1 S2 normal no gallop . Respiratory: No rales or rhonchi noted . Abdomen: soft . Skin: no rash seen on limited exam . Musculoskeletal: not rigid . Psychiatric:unable to assess . Neurologic: no seizure no involuntary movements         Lab Data:   Basic Metabolic Panel: No results for input(s): NA, K, CL, CO2, GLUCOSE, BUN, CREATININE, CALCIUM, MG, PHOS in the last 168 hours.  ABG: No results for input(s): PHART, PCO2ART, PO2ART, HCO3, O2SAT in the last 168 hours.  Liver Function Tests: No results for input(s): AST, ALT, ALKPHOS, BILITOT, PROT, ALBUMIN in the last 168 hours. No results for input(s): LIPASE, AMYLASE in the last 168 hours. No results for input(s): AMMONIA in the last 168 hours.  CBC: Recent Labs  Lab 06/14/18 0559 06/15/18 0606 06/16/18 0453 06/17/18 0502  WBC 19.7* 16.8* 18.2* 16.6*  HGB 8.6* 9.7* 8.7* 8.8*  HCT 29.9* 32.9* 29.7* 30.3*  MCV 81.5 81.6 80.9 81.2  PLT 926* 922* 790* 682*    Cardiac Enzymes: No results for input(s): CKTOTAL, CKMB, CKMBINDEX, TROPONINI in the last 168 hours.  BNP  (last 3 results) No results for input(s): BNP in the last 8760 hours.  ProBNP (last 3 results) No results for input(s): PROBNP in the last 8760 hours.  Radiological Exams: No results found.  Assessment/Plan Active Problems:   Acute on chronic respiratory failure with hypoxia (HCC)   COPD, severe (HCC)   Rheumatoid arthritis (HCC)   Steroid-dependent chronic obstructive pulmonary disease (HCC)   Atrial fibrillation, chronic   1. Acute on chronic respiratory failure with hypoxia continue with nasal cannula and secretion management. 2. Severe COPD at baseline 3. Rheumatoid arthritis treated improving 4. Steroid dependent continue supportive care 5. Chronic atrial fibrillation at baseline   I have personally seen and evaluated the patient, evaluated laboratory and imaging results, formulated the assessment and plan and placed orders. The Patient requires high complexity decision making for assessment and support.  Case was discussed on Rounds with the Respiratory Therapy Staff  Yevonne Pax, MD [facility] Pulmonary Critical Care Medicine Sleep Medicine

## 2018-06-21 ENCOUNTER — Other Ambulatory Visit (HOSPITAL_COMMUNITY): Payer: Medicare HMO

## 2018-06-21 LAB — COMPREHENSIVE METABOLIC PANEL
ALBUMIN: 2.9 g/dL — AB (ref 3.5–5.0)
ALT: 16 U/L (ref 0–44)
AST: 17 U/L (ref 15–41)
Alkaline Phosphatase: 25 U/L — ABNORMAL LOW (ref 38–126)
Anion gap: 7 (ref 5–15)
BUN: 11 mg/dL (ref 8–23)
CHLORIDE: 105 mmol/L (ref 98–111)
CO2: 28 mmol/L (ref 22–32)
Calcium: 9.1 mg/dL (ref 8.9–10.3)
Creatinine, Ser: 0.35 mg/dL — ABNORMAL LOW (ref 0.44–1.00)
GFR calc Af Amer: >60 mL/min (ref >60)
GFR calc non Af Amer: >60 mL/min (ref >60)
Glucose, Bld: 94 mg/dL (ref 70–99)
Potassium: 3.2 mmol/L — ABNORMAL LOW (ref 3.5–5.1)
Sodium: 140 mmol/L (ref 135–145)
Total Bilirubin: 0.5 mg/dL (ref 0.3–1.2)
Total Protein: 5.3 g/dL — ABNORMAL LOW (ref 6.5–8.1)

## 2018-06-21 LAB — CK TOTAL AND CKMB (NOT AT ARMC)
CK TOTAL: 13 U/L — AB (ref 38–234)
CK, MB: 1.1 ng/mL (ref 0.5–5.0)
Relative Index: INVALID (ref 0.0–2.5)

## 2018-06-21 LAB — CBC
HCT: 31.2 % — ABNORMAL LOW (ref 36.0–46.0)
Hemoglobin: 9 g/dL — ABNORMAL LOW (ref 12.0–15.0)
MCH: 23.1 pg — ABNORMAL LOW (ref 26.0–34.0)
MCHC: 28.8 g/dL — ABNORMAL LOW (ref 30.0–36.0)
MCV: 80 fL (ref 80.0–100.0)
Platelets: 629 10*3/uL — ABNORMAL HIGH (ref 150–400)
RBC: 3.9 MIL/uL (ref 3.87–5.11)
RDW: 17 % — ABNORMAL HIGH (ref 11.5–15.5)
WBC: 13.7 10*3/uL — AB (ref 4.0–10.5)
nRBC: 0 % (ref 0.0–0.2)

## 2018-06-21 LAB — TROPONIN I
Troponin I: <0.03 ng/mL (ref <0.03)
Troponin I: <0.03 ng/mL (ref <0.03)

## 2018-06-21 LAB — BRAIN NATRIURETIC PEPTIDE: B Natriuretic Peptide: 279.4 pg/mL — ABNORMAL HIGH (ref 0.0–100.0)

## 2018-06-21 LAB — MAGNESIUM: Magnesium: 1.8 mg/dL (ref 1.7–2.4)

## 2018-06-21 LAB — POTASSIUM: Potassium: 3.6 mmol/L (ref 3.5–5.1)

## 2018-06-21 LAB — PHOSPHORUS: Phosphorus: 3.6 mg/dL (ref 2.5–4.6)

## 2018-06-22 LAB — BASIC METABOLIC PANEL
Anion gap: 8 (ref 5–15)
BUN: 16 mg/dL (ref 8–23)
CO2: 31 mmol/L (ref 22–32)
CREATININE: 0.42 mg/dL — AB (ref 0.44–1.00)
Calcium: 9.5 mg/dL (ref 8.9–10.3)
Chloride: 102 mmol/L (ref 98–111)
GFR calc Af Amer: >60 mL/min (ref >60)
GFR calc non Af Amer: >60 mL/min (ref >60)
Glucose, Bld: 85 mg/dL (ref 70–99)
Potassium: 3.1 mmol/L — ABNORMAL LOW (ref 3.5–5.1)
Sodium: 141 mmol/L (ref 135–145)

## 2018-06-25 NOTE — Anesthesia Postprocedure Evaluation (Signed)
Anesthesia Post Note  Patient: Brittney Lowe  Procedure(s) Performed: ESOPHAGOGASTRODUODENOSCOPY (EGD) WITH PROPOFOL (N/A )     Patient location during evaluation: Endoscopy Anesthesia Type: MAC Level of consciousness: awake and alert Pain management: pain level controlled Vital Signs Assessment: post-procedure vital signs reviewed and stable Respiratory status: spontaneous breathing, nonlabored ventilation, respiratory function stable and patient connected to nasal cannula oxygen Cardiovascular status: blood pressure returned to baseline and stable Postop Assessment: no apparent nausea or vomiting Anesthetic complications: no    Last Vitals:  Vitals:   06/14/18 1220 06/14/18 1230  BP: (!) 85/36 (!) 116/37  Pulse: 77 77  Resp: 18 18  Temp:    SpO2: 100% 100%    Last Pain:  Vitals:   06/14/18 1230  TempSrc:   PainSc: [place]

## 2020-01-19 IMAGING — CT CT ABD-PELV W/ CM
2 of 5 series · 16 of 46 positions shown, 18 images · IV contrast (APPLIED)
Comparison: 06/11/2018

CLINICAL DATA: Abdominal pain and decreased hemoglobin

EXAM:
CT ABDOMEN AND PELVIS WITH CONTRAST
TECHNIQUE: Multidetector CT imaging of the abdomen and pelvis was performed
using the standard protocol following bolus administration of
intravenous contrast.
CONTRAST:  80mL OMNIPAQUE IOHEXOL 300 MG/ML  SOLN

[Series 3: abdomen 5.0 · axial · 0.86mm/px · z∈[+744,+1149]mm · 13 of 93 slices shown, 15 images]
[im 6/93  soft-tissue]
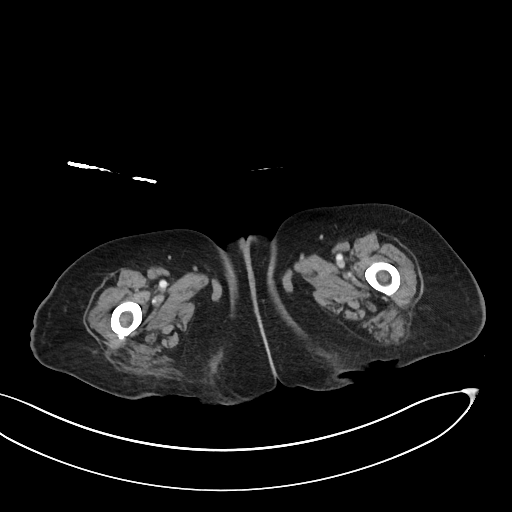
[im 6/93  bone]
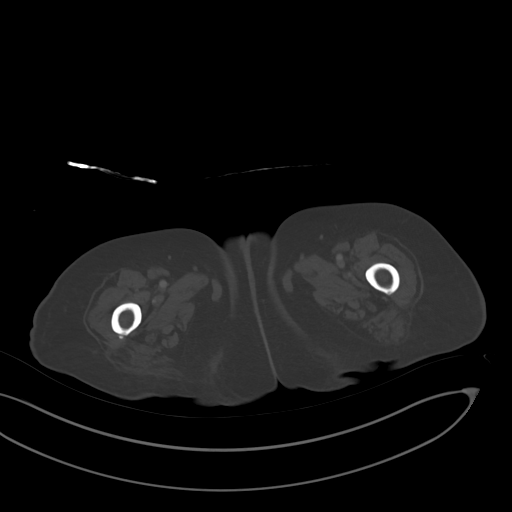
[im 12/93  soft-tissue]
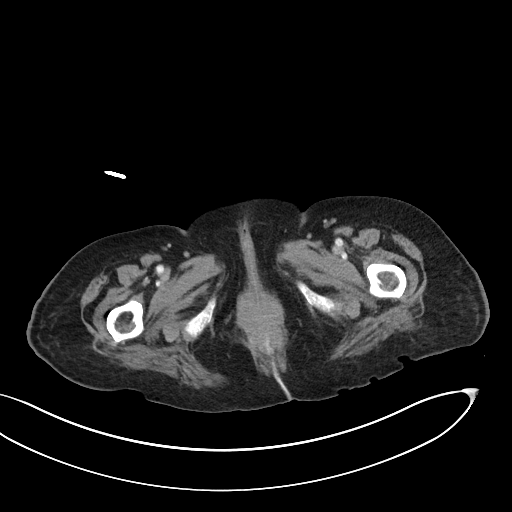
[im 18/93  soft-tissue]
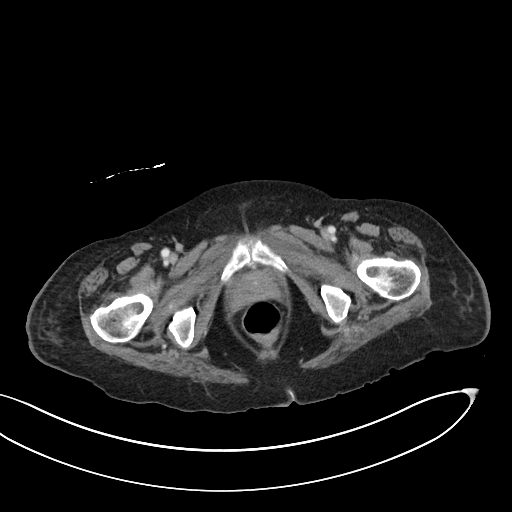
[im 29/93  soft-tissue]
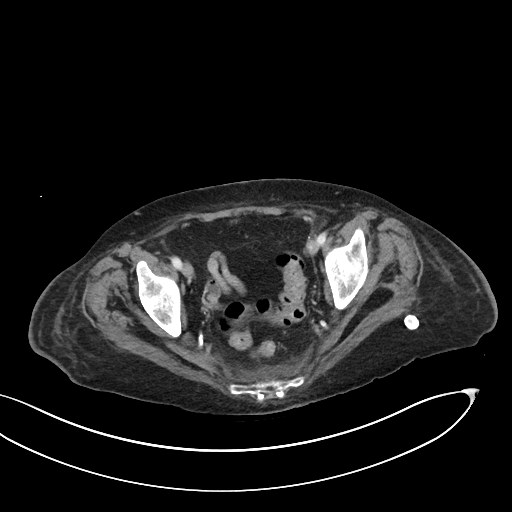
[im 35/93  soft-tissue]
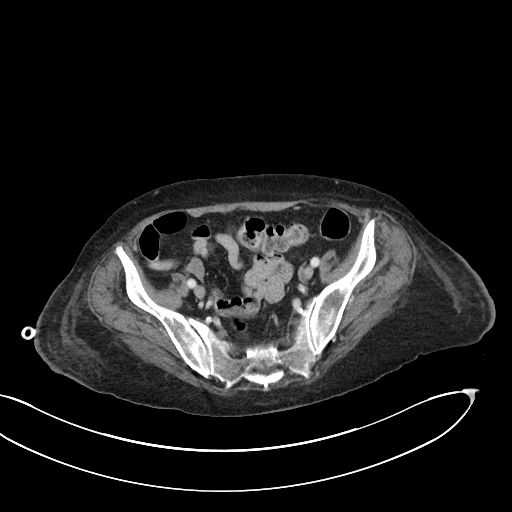
[im 41/93  soft-tissue]
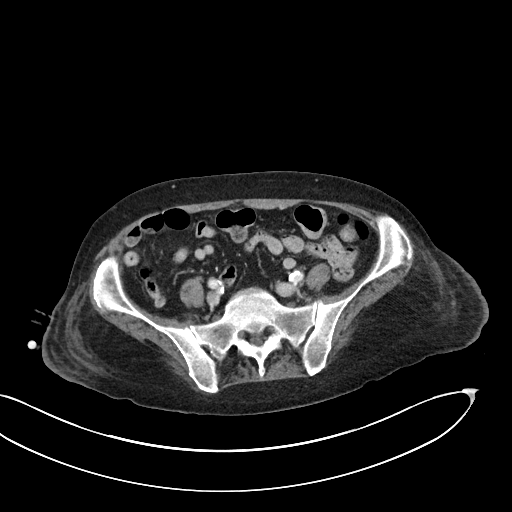
[im 47/93  soft-tissue]
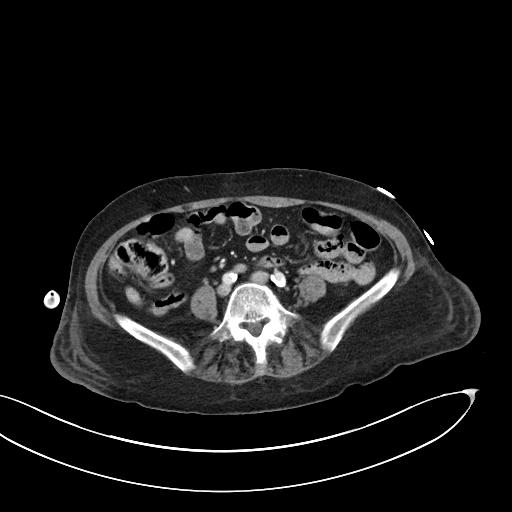
[im 52/93  soft-tissue]
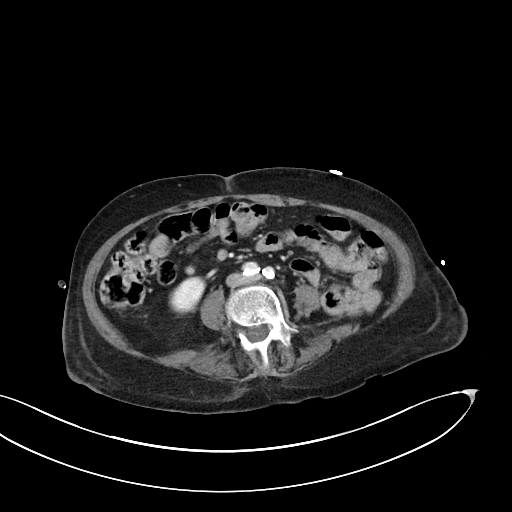
[im 58/93  soft-tissue]
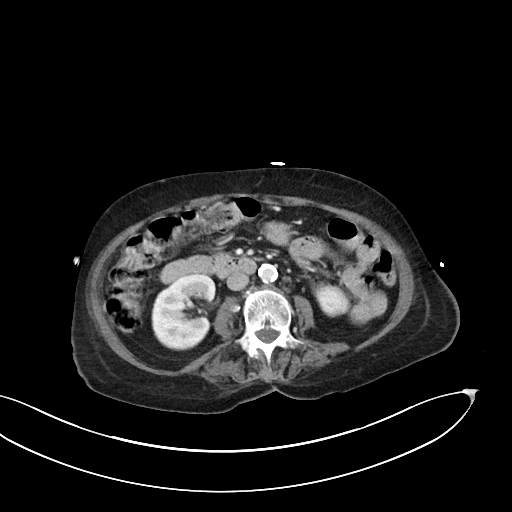
[im 58/93  bone]
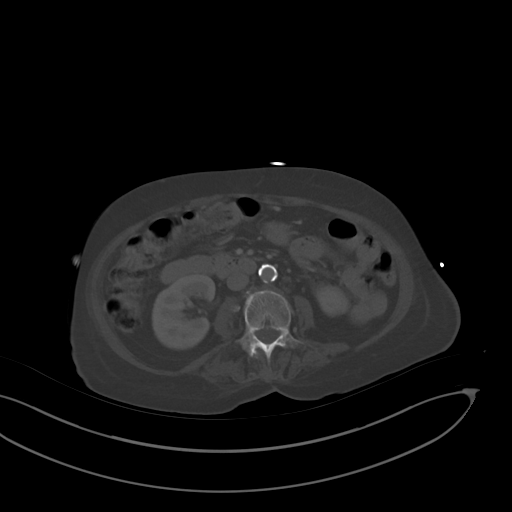
[im 64/93  soft-tissue]
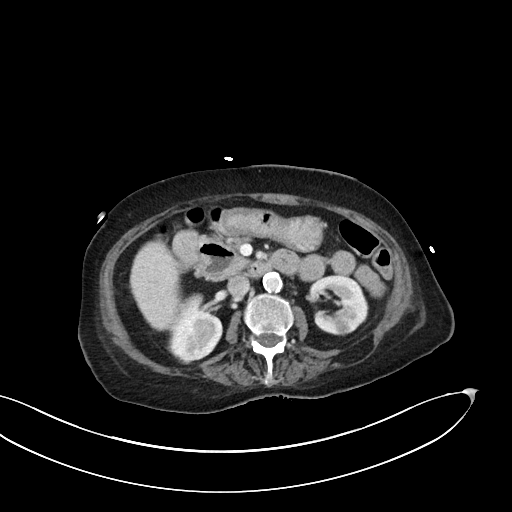
[im 75/93  soft-tissue]
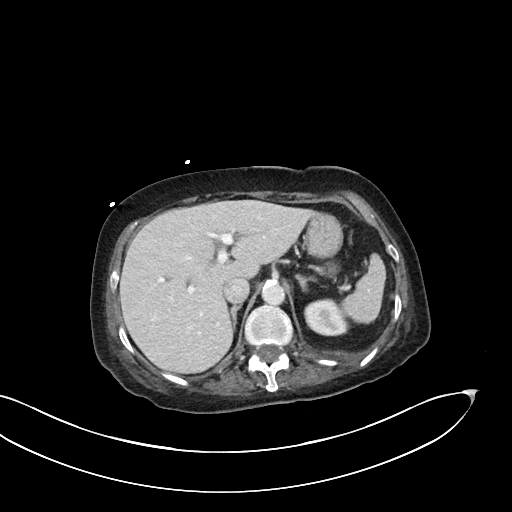
[im 81/93  soft-tissue]
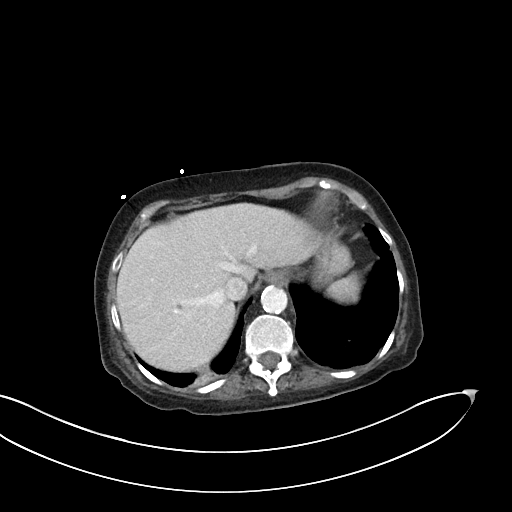
[im 87/93  soft-tissue]
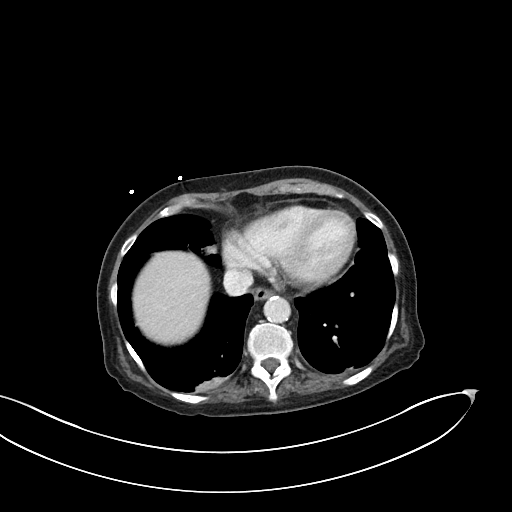

[Series 6: abdomen 3.0 mpr cor · coronal · 0.76mm/px · 3 of 75 slices shown]
[im 25/75  soft-tissue]
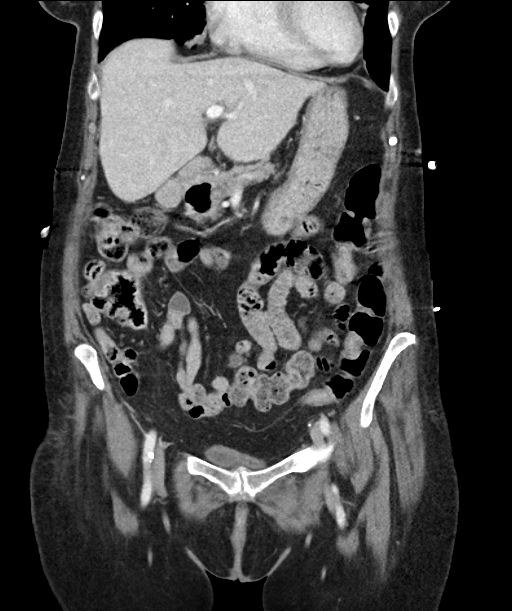
[im 33/75  soft-tissue]
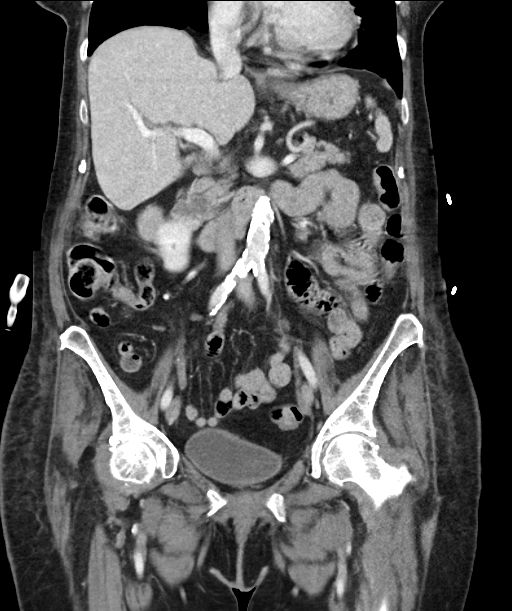
[im 42/75  soft-tissue]
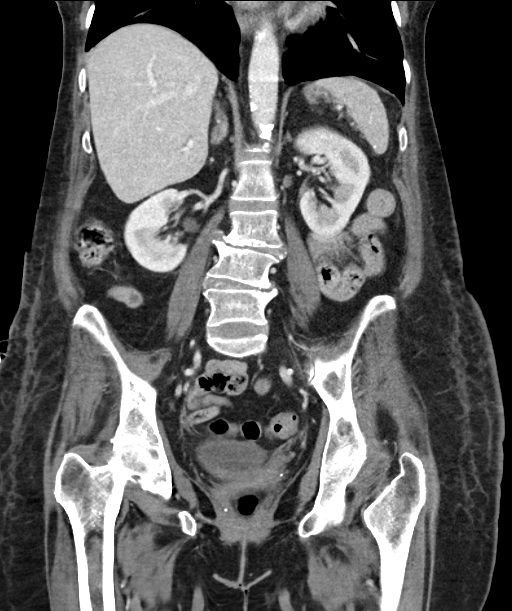

[16 of 46 positions shown; findings below may reference images not displayed]

FINDINGS: Lower chest: Lung bases demonstrate mild bibasilar atelectasis
posteriorly not well appreciated on prior plain film examination.

Hepatobiliary: No focal liver abnormality is seen. Status post
cholecystectomy. Mild fullness of the common bile duct is noted
consistent with the post cholecystectomy state.

Pancreas: Unremarkable. No pancreatic ductal dilatation or
surrounding inflammatory changes.

Spleen: Normal in size without focal abnormality.

Adrenals/Urinary Tract: Adrenal glands are within normal limits.
Kidneys are well visualized bilaterally with scattered renal cystic
change. No renal calculi or obstructive changes are noted. The
bladder is partially distended.

Stomach/Bowel: Gastrostomy catheter is noted in place. Stomach is
decompressed. A prominent duodenal diverticulum is noted adjacent to
the head of the pancreas. The appendix is not well seen. No
inflammatory changes to suggest appendicitis are noted. Mild
diverticular change of the colon is noted without evidence of
diverticulitis. The small bowel is otherwise within normal limits.

Vascular/Lymphatic: Aortic atherosclerosis. No enlarged abdominal or
pelvic lymph nodes.

Reproductive: Status post hysterectomy. No adnexal masses.

Other: No abdominal wall hernia or abnormality. No abdominopelvic
ascites.

Musculoskeletal: Degenerative changes of the lumbar spine are seen.
No compression deformities are noted.
IMPRESSION: Mild bibasilar atelectatic changes.

Renal cystic change.  No obstruction is noted.

Diverticulosis without evidence of diverticulitis.

Gastrostomy in place.

No acute abnormality noted.

## 2020-02-16 DEATH — deceased
